# Patient Record
Sex: Female | Born: 1950 | Race: White | Hispanic: No | Marital: Married | Smoking: Never smoker
Health system: Southern US, Community
[De-identification: ages and names within clinical notes are randomized; demographics above are authoritative.]

## PROBLEM LIST (undated history)

## (undated) DIAGNOSIS — F32A Depression, unspecified: Secondary | ICD-10-CM

## (undated) DIAGNOSIS — M329 Systemic lupus erythematosus, unspecified: Secondary | ICD-10-CM

## (undated) DIAGNOSIS — M87051 Idiopathic aseptic necrosis of right femur: Secondary | ICD-10-CM

## (undated) DIAGNOSIS — M8580 Other specified disorders of bone density and structure, unspecified site: Secondary | ICD-10-CM

## (undated) DIAGNOSIS — N3010 Interstitial cystitis (chronic) without hematuria: Secondary | ICD-10-CM

## (undated) DIAGNOSIS — F419 Anxiety disorder, unspecified: Secondary | ICD-10-CM

## (undated) DIAGNOSIS — M87052 Idiopathic aseptic necrosis of left femur: Secondary | ICD-10-CM

## (undated) DIAGNOSIS — F329 Major depressive disorder, single episode, unspecified: Secondary | ICD-10-CM

## (undated) DIAGNOSIS — N390 Urinary tract infection, site not specified: Secondary | ICD-10-CM

## (undated) DIAGNOSIS — E079 Disorder of thyroid, unspecified: Secondary | ICD-10-CM

## (undated) HISTORY — PX: TOTAL ABDOMINAL HYSTERECTOMY: SHX209

## (undated) HISTORY — DX: Idiopathic aseptic necrosis of left femur: M87.052

## (undated) HISTORY — DX: Interstitial cystitis (chronic) without hematuria: N30.10

## (undated) HISTORY — DX: Anxiety disorder, unspecified: F41.9

## (undated) HISTORY — DX: Systemic lupus erythematosus, unspecified: M32.9

## (undated) HISTORY — DX: Major depressive disorder, single episode, unspecified: F32.9

## (undated) HISTORY — DX: Idiopathic aseptic necrosis of right femur: M87.051

## (undated) HISTORY — DX: Urinary tract infection, site not specified: N39.0

## (undated) HISTORY — DX: Disorder of thyroid, unspecified: E07.9

## (undated) HISTORY — PX: TUBAL LIGATION: SHX77

## (undated) HISTORY — DX: Other specified disorders of bone density and structure, unspecified site: M85.80

## (undated) HISTORY — PX: CYSTOSCOPY: SUR368

## (undated) HISTORY — DX: Depression, unspecified: F32.A

## (undated) HISTORY — PX: URETHRAL DILATION: SUR417

## (undated) HISTORY — PX: WISDOM TOOTH EXTRACTION: SHX21

---

## 2007-06-30 ENCOUNTER — Other Ambulatory Visit: Payer: Self-pay

## 2007-06-30 ENCOUNTER — Emergency Department: Payer: Self-pay | Admitting: Unknown Physician Specialty

## 2007-07-01 ENCOUNTER — Ambulatory Visit: Payer: Self-pay | Admitting: Unknown Physician Specialty

## 2008-02-20 ENCOUNTER — Emergency Department: Payer: Self-pay | Admitting: Emergency Medicine

## 2008-03-05 ENCOUNTER — Ambulatory Visit: Payer: Self-pay | Admitting: Gynecology

## 2008-04-09 ENCOUNTER — Ambulatory Visit: Payer: Self-pay | Admitting: Gynecology

## 2008-07-17 ENCOUNTER — Emergency Department: Payer: Self-pay | Admitting: Emergency Medicine

## 2008-07-17 ENCOUNTER — Other Ambulatory Visit: Payer: Self-pay

## 2010-02-25 ENCOUNTER — Ambulatory Visit: Payer: Self-pay | Admitting: Obstetrics and Gynecology

## 2010-02-26 ENCOUNTER — Encounter: Payer: Self-pay | Admitting: Obstetrics & Gynecology

## 2010-02-26 LAB — CONVERTED CEMR LAB
Clue Cells Wet Prep HPF POC: NONE SEEN
Trich, Wet Prep: NONE SEEN
Yeast Wet Prep HPF POC: NONE SEEN

## 2011-02-26 ENCOUNTER — Ambulatory Visit (INDEPENDENT_AMBULATORY_CARE_PROVIDER_SITE_OTHER): Payer: Medicare Other | Admitting: Obstetrics & Gynecology

## 2011-02-26 DIAGNOSIS — Z01419 Encounter for gynecological examination (general) (routine) without abnormal findings: Secondary | ICD-10-CM

## 2011-04-07 NOTE — Assessment & Plan Note (Signed)
[name], Martha Mckinney NO.:  [id number]   MEDICAL RECORD NO.:  [id number]          PATIENT TYPE:  POB   LOCATION:  [facility] at [facility]         FACILITY:  [facility]   PHYSICIAN:  Ginger Carne, MD DATE OF BIRTH:  1951-07-08   DATE OF SERVICE:  03/05/2008                                  CLINIC NOTE   This is a 60 year old Caucasian female who presents with the primary  complaint of dyspareunia and vaginal dryness and burning with voiding.  She has had a hysterectomy in the 1980s.  The patient states she has  tried Premarin vaginal cream and has had irritation from this  medication.  She has also tried an Field seismologist, which apparently has also  been irritating to her.  She states that Astroglide has not been  beneficial.  Her dyspareunia and inability to have intercourse has had a  negative impact in her quality of marital life.   Her medical history is complicated and the reader is referred to the  current medication list as well as her gynecological history sheet for  more information.  She denies genuine urinary stress incontinence or  fecal incontinence.   PHYSICAL EXAM:  External genitalia, vulva and vagina demonstrates one-  finger admittance with significant vaginismus.  No discharge is noted.  Both adnexa are palpable, found to be normal.  No evidence on the vulva  of abnormalities suggestive of lichen sclerosis or hypertrophic lesions.   IMPRESSION:  Postmenopausal vaginal atrophy.   PLAN:  I will try a very low dose of Estrace to use at a small dose of  the time, a fingertip, to be applied in the vagina and in the fourchette  to see if the patient will tolerate this.  If so, the dosage will be  advanced to add more to up to 1 g three times a week.  The patient was  advised that she may continue to have burning with same.  However, the  patient was explained that the either conjugated estrogens or estradiol  at this time are the only vaginal medications  that would be available to  her.  The other option would be to increase her oral estradiol, but I am  uncertain that this will be of much benefit to her.  I have asked her to  return in 3 weeks for follow-up.           ______________________________  Ginger Carne, MD     SHB/MEDQ  D:  03/05/2008  T:  03/05/2008  Job:  [id number]

## 2011-04-07 NOTE — Assessment & Plan Note (Signed)
Martha Mckinney, BISKUP NO.:  [id number]   MEDICAL RECORD NO.:  [id number]          PATIENT TYPE:  POB   LOCATION:  [facility] at [place]         FACILITY:  [facility]   PHYSICIAN:  Catalina Antigua, MD     DATE OF BIRTH:  05-13-1951   DATE OF SERVICE:  02/25/2010                                  CLINIC NOTE   This is a 60 year old female who presents for evaluation of a 50-month  history of vaginal irritation.  The patient states that for the past 2  months, she has been treated several times for a yeast infection with  Diflucan, Gyne-Lotrimin, and metronidazole without any relief of her  symptoms.  The patient states that she gets temporary relief like one  day or two, but symptoms of severe pruritus and dyspareunia return.  The  patient denies any vaginal dryness, abnormal bleeding, but only reports  the presence of a cottage-cheese discharge.  The patient is otherwise up  to date in her healthcare.  She has had a mammogram this year and is  being followed by a primary care physician.   PHYSICAL EXAMINATION:  VITAL SIGNS:  Blood pressure is 114/86, pulse of  77, weight of 112 pounds, height of 5 feet.  ABDOMEN:  Soft, nontender, nondistended.  PELVIC:  Normal external female genitalia, normal appearing vaginal  mucosa with thick whitish adherent discharge.  Vaginal cuff is intact  and the cervix was visualized.   ASSESSMENT AND PLAN:  This is a 60 year old with clinical findings of a  yeast infection.  Wet prep and yeast culture were collected.  Prescription for boric acid was provided.  The patient was instructed to  return if symptoms are not alleviated after her course of her boric  acid.           ______________________________  Catalina Antigua, MD     PC/MEDQ  D:  02/25/2010  T:  02/26/2010  Job:  [id number]

## 2011-04-07 NOTE — Assessment & Plan Note (Signed)
Martha Mckinney, Martha Mckinney NO.:  [id number]   MEDICAL RECORD NO.:  [id number]          PATIENT TYPE:  POB   LOCATION:  [facility] at [facility] [place] [facility]         FACILITY:  [facility]   PHYSICIAN:  Ginger Carne, MD DATE OF BIRTH:  Nov 18, 1951   DATE OF SERVICE:  04/09/2008                                  CLINIC NOTE   This patient returns today for followup from an initial office visit of  March 05, 2008.  This is a 60 year old Caucasian female who presented  with a primary complaint of dyspareunia and vaginal dryness with burning  on voiding.  She had a hysterectomy in the 1980s and using Premarin has  caused her significant irritation with worsening of burning.  This also  included an Estring with similar symptoms.  Astroglide has not been  beneficial insofar as intercourse which has not been possible over  recent 1 to 2 years.  The patient was initially started on estradiol 1/2  mg daily and low dose of Estrace to use a fingertip amount in the vagina  and fourchette once to twice weekly to see if this would be both  tolerated and helpful.  She returns today stating that given the minimal  dosing that she has used, she is able to tolerate said amounts of  Estrace cream without burning and feels slightly better as far as  vaginal burning is concerned.  I did not do an examination today,  however, I have asked her to increase the Estrace cream amount to 1 dab  on the finger three times a week for the next 2 weeks including the  fourchette and throughout the vagina.  If she can tolerate this, I asked  her to go to 1/2 gram in an applicator two to three times a week for the  next 3 to 4 weeks followed by 1 gram for the next 2 to 3 weeks  indefinitely.  Her estradiol was increased to 1 gram a day and she also  was asked to return in 2 months for followup.           ______________________________  Ginger Carne, MD     SHB/MEDQ  D:  04/09/2008  T:  04/09/2008  Job:  [id number]

## 2011-04-21 ENCOUNTER — Ambulatory Visit: Payer: Medicare Other | Admitting: Obstetrics & Gynecology

## 2011-06-05 NOTE — Assessment & Plan Note (Signed)
NAME:  Martha Mckinney, Martha Mckinney NO.:  [id number]  MEDICAL RECORD NO.:  [id number]          PATIENT TYPE:  POB  LOCATION:  [facility] at [facility]         FACILITY:  [facility]  PHYSICIAN:  Allie Bossier, MD        DATE OF BIRTH:  09-Mar-1951  DATE OF SERVICE:  02/26/2011                                 CLINIC NOTE  HISTORY OF PRESENT ILLNESS:  Ms. Tash is a 60 year old married white gravida 2, para 2.  She has a 59 year old estranged daughter with 3 grandchildren and a 41 year old son who is currently disabled and lives in [place].  She is here for her annual exam.  Her complaint today is of many years history of vulvar burning and pruritus.  She has been tried in the distant past on vaginal estrogen but the immediate affect is to have more burning with the creams.  She has had numerous wet preps that always come up normal.  She has been treated empirically with boric acid, metronidazole, clotrimazole, etc.  None of these have relieved her symptoms.  She is finding intercourse extremely difficult with her "well in doubt husband" and essentially does not have sex because of this issue.  PAST MEDICAL HISTORY: 1. Lupus. 2. Anxiety. 3. Depression. 4. Hypothyroidism. 5. 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 6. Osteopenia. 7. Recurrent UTIs. 8. Probable interstitial cystitis.  REVIEW OF SYSTEMS:  This is her second marriage and she has been married for approximately 8 years.  She has been abstinent for last 6 months at least.  Her husband is recently status post prostate surgery but is still able to maintain an erection.  She has always had a decreased libido.  She sees Dr. Vickki Hearing as her rheumatologist and Dr. Genia Hotter in [place] as her primary care doctor.  Her last mammogram was in March 2011 and she scheduled that herself in [place].  She had a colonoscopy in approximately 2006 and polyps were found.  Of note, she is going to call her GI doctor and see when she needs her  next colonoscopy.  PAST SURGICAL HISTORY:  Two C-sections, tubal ligation, TAH, wisdom teeth extraction, and multiple cystoscopies and urethral dilations.  ALLERGIES:  No latex allergies.  Her drug allergies are SULFA and ERYTHROMYCIN.  MEDICATIONS: 1. Klonopin 2 mg p.r.n. 2. Levothyroxine 50 mcg daily. 3. Topamax 100 mg b.i.d. 4. Spironolactone when she has excess fluid build up from her     prednisone. 5. Omeprazole daily. 6. Prednisone 20 mg daily currently. 7. Calcium b.i.d.  PHYSICAL EXAMINATION:  GENERAL:  Very pleasant, well-nourished, thin white female. VITAL SIGNS:  Height 5 feet, weight 102 pounds, blood pressure 111/74, and pulse 67. HEENT:  Normal. HEART:  Regular rate and rhythm. LUNGS:  Clear to auscultation bilaterally. BREASTS:  Normal. ABDOMEN:  Scaphoid.  Well-healed vertical incision.  No palpable hepatosplenomegaly. EXTERNAL GENITALIA:  Marked atrophy.  Her vagina has no lesions, no discharge, and marked atrophy was noted.  Bimanual exam reveals no masses.  ASSESSMENT AND PLAN: 1. Annual exam.  I have not checked the Pap smear because her uterus     was removed for benign indications, but I have recommended self-  breast and self-vulvar exams along with a speculum exam annually. 2. Longstanding vulvar pruritus/burning.  Since vaginal estrogen tends     to exacerbate her issues, I am recommending a p.o. estrogen     (Enjuvia 0.9 mg that is what we have samples of).  This will have     multiple functions.  It will not only hopefully cure her vaginal     atrophy but it will also help her osteopenia. 3. Osteopenia.  I am scheduling a DEXA scan.  With regard to her     vulva, she will see me in 2 months.  If her symptoms are not     dramatically improved, I will recommend a random biopsy of her     vulva just to see if there is any underlying dermatologic     condition.     Allie Bossier, MD    MCD/MEDQ  D:  02/26/2011  T:  02/27/2011  Job:   [id number]

## 2011-07-29 ENCOUNTER — Encounter: Payer: Self-pay | Admitting: Gynecology

## 2011-07-30 ENCOUNTER — Ambulatory Visit (INDEPENDENT_AMBULATORY_CARE_PROVIDER_SITE_OTHER): Payer: Medicare Other | Admitting: Obstetrics & Gynecology

## 2011-07-30 ENCOUNTER — Encounter: Payer: Self-pay | Admitting: Obstetrics & Gynecology

## 2011-07-30 VITALS — BP 110/67 | HR 71 | Ht <= 58 in | Wt 91.0 lb

## 2011-07-30 DIAGNOSIS — N951 Menopausal and female climacteric states: Secondary | ICD-10-CM

## 2011-07-30 MED ORDER — ESTRATEST 1.25-2.5 MG PO TABS: 1.0000 | ORAL_TABLET | Freq: Every day | ORAL | Status: DC

## 2011-07-30 NOTE — Progress Notes (Signed)
  Subjective:    Patient ID: Martha Mckinney, female    DOB: Oct 04, 1951, 60 y.o.   MRN: [id number]  HPI Martha Mckinney is here because she would like to try a different ERT.  The Enjuvia caused her vulvar burning to be increased.  She would like to try one with testosterone as she complains of a decreased libido.   Review of Systems     Objective:   Physical Exam        Assessment & Plan:  Menopausal symptoms-rec Estratest

## 2011-09-02 ENCOUNTER — Inpatient Hospital Stay: Payer: Self-pay | Admitting: Psychiatry

## 2011-09-17 ENCOUNTER — Ambulatory Visit: Payer: Medicare Other | Admitting: Obstetrics & Gynecology

## 2011-09-17 DIAGNOSIS — N959 Unspecified menopausal and perimenopausal disorder: Secondary | ICD-10-CM

## 2011-11-03 ENCOUNTER — Telehealth: Payer: Self-pay

## 2011-11-03 NOTE — Telephone Encounter (Signed)
Patient needs boric acid suppositories called in, this is the only thing that works for her yeast. She needs them today if all possible by 4pm her husband gets off at that time and will be picking them up. [facility] ph: [phone], thanks!

## 2011-12-24 ENCOUNTER — Ambulatory Visit (INDEPENDENT_AMBULATORY_CARE_PROVIDER_SITE_OTHER): Payer: Medicare Other | Admitting: Obstetrics & Gynecology

## 2011-12-24 ENCOUNTER — Other Ambulatory Visit: Payer: Self-pay | Admitting: Obstetrics & Gynecology

## 2011-12-24 DIAGNOSIS — L292 Pruritus vulvae: Secondary | ICD-10-CM

## 2011-12-24 DIAGNOSIS — L293 Anogenital pruritus, unspecified: Secondary | ICD-10-CM
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
## 2011-12-31 ENCOUNTER — Ambulatory Visit (INDEPENDENT_AMBULATORY_CARE_PROVIDER_SITE_OTHER): Payer: Medicare Other | Admitting: Obstetrics & Gynecology

## 2011-12-31 ENCOUNTER — Encounter: Payer: Self-pay | Admitting: Obstetrics & Gynecology

## 2011-12-31 VITALS — BP 98/69 | HR 60 | Ht 59.0 in | Wt 102.0 lb

## 2011-12-31 DIAGNOSIS — L293 Anogenital pruritus, unspecified: Secondary | ICD-10-CM

## 2011-12-31 DIAGNOSIS — L292 Pruritus vulvae: Secondary | ICD-10-CM

## 2011-12-31 MED ORDER — CLOBETASOL PROPIONATE 0.05 % EX GEL: CUTANEOUS | Status: DC

## 2011-12-31 NOTE — Progress Notes (Signed)
  Subjective:    Patient ID: Martha Mckinney, female    DOB: [birth year], 61 y.o.   MRN: [id number]  HPI  She is here today to review her labs/pathology.  Review of Systems     Objective:   Physical Exam   Well healed biopsy site, vagina mucosa with severe atrophy.     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 She will RTC in 1 month. I may add vagifem if she is not asymptomatic by then.

## 2012-01-01 ENCOUNTER — Encounter: Payer: Self-pay | Admitting: Obstetrics & Gynecology

## 2012-01-28 ENCOUNTER — Ambulatory Visit (INDEPENDENT_AMBULATORY_CARE_PROVIDER_SITE_OTHER): Payer: Medicare Other | Admitting: Obstetrics & Gynecology

## 2012-01-28 ENCOUNTER — Encounter: Payer: Self-pay | Admitting: Obstetrics & Gynecology

## 2012-01-28 VITALS — BP 116/84 | HR 69 | Ht 59.0 in | Wt 103.0 lb

## 2012-01-28 DIAGNOSIS — N952 Postmenopausal atrophic vaginitis: Secondary | ICD-10-CM

## 2012-01-28 MED ORDER — ESTRADIOL 10 MCG VA TABS: ORAL_TABLET | VAGINAL | Status: DC

## 2012-01-28 NOTE — Progress Notes (Signed)
  Subjective:    Patient ID: Martha Mckinney, female    DOB: Jul 25, 1951, 61 y.o.   MRN: [id number]  HPI   She is here for follow up of her vulvar pain/burning. After using the temovate several times per week, her symptoms have resolved! She is very happy. Review of Systems Mammogram normal about 6 months ago    Objective:   Physical Exam Atrophic vulva, but no erythema       Assessment & Plan:  Continue temovate indefinitely, Add vagifem RTC for annual

## 2013-08-29 ENCOUNTER — Ambulatory Visit (INDEPENDENT_AMBULATORY_CARE_PROVIDER_SITE_OTHER): Payer: Medicare Other | Admitting: Obstetrics & Gynecology

## 2013-08-29 ENCOUNTER — Encounter: Payer: Self-pay | Admitting: Obstetrics & Gynecology

## 2013-08-29 VITALS — BP 134/74 | HR 61 | Ht <= 58 in | Wt 99.2 lb

## 2013-08-29 DIAGNOSIS — R350 Frequency of micturition: Secondary | ICD-10-CM

## 2013-08-29 DIAGNOSIS — Z23 Encounter for immunization: Secondary | ICD-10-CM

## 2013-08-29 DIAGNOSIS — Z01419 Encounter for gynecological examination (general) (routine) without abnormal findings: Secondary | ICD-10-CM

## 2013-08-29 DIAGNOSIS — Z0000 Encounter for general adult medical examination without abnormal findings: Secondary | ICD-10-CM

## 2013-08-29 LAB — POCT URINALYSIS DIPSTICK
Blood, UA: NEGATIVE
Nitrite, UA: NEGATIVE
Urobilinogen, UA: NEGATIVE
pH, UA: 6

## 2013-08-29 NOTE — Patient Instructions (Signed)
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
## 2013-08-31 ENCOUNTER — Telehealth: Payer: Self-pay | Admitting: *Deleted

## 2013-08-31 NOTE — Telephone Encounter (Signed)
EStriodiol vaginal cream called into [facility] pharmacy.  0.2mg  using 1/2 gram per vagina 3x week.

## 2013-09-04 ENCOUNTER — Telehealth: Payer: Self-pay | Admitting: *Deleted

## 2013-09-04 NOTE — Telephone Encounter (Signed)
Pt called and wanted to know her urine culture results, because pt was still having UTI symptoms.  I called in Cipro 500 mg, take 1 tablet BID x 3 days #6 no refills.  Pt aware.

## 2013-09-27 ENCOUNTER — Telehealth: Payer: Self-pay | Admitting: *Deleted

## 2013-09-27 NOTE — Telephone Encounter (Signed)
Pt called and c/o symptoms of a UTI.  I called in Levaquin 500 mg, take 1 tablet daily for 5 days.  #5 with no refills.  Pt aware.  Pt needs to come into the office for a visit if this medication does not help.

## 2014-03-14 ENCOUNTER — Telehealth: Payer: Self-pay | Admitting: *Deleted

## 2014-03-14 DIAGNOSIS — L900 Lichen sclerosus et atrophicus: Secondary | ICD-10-CM

## 2014-03-14 MED ORDER — CLOBETASOL PROPIONATE 0.05 % EX GEL: CUTANEOUS | Status: DC

## 2014-03-14 NOTE — Telephone Encounter (Signed)
Patient needs a refill of her clobetasol cream.  She is also having trouble using her estrogen cream and is interested in switching to an estrogen tablet to help her issue.  She will make an appointment to see the physician to discuss this.

## 2014-04-11 NOTE — Telephone Encounter (Signed)
ERROR NOTE

## 2014-04-25 ENCOUNTER — Ambulatory Visit: Payer: Medicare Other | Admitting: Obstetrics & Gynecology

## 2015-10-31 ENCOUNTER — Telehealth: Payer: Self-pay

## 2015-10-31 NOTE — Telephone Encounter (Signed)
Received a fax from [facility], on the face sheet there was a written message "Pt asked for referral w/ Dr. Marice Potterove regarding left ovarian cyst that was found on CT during urology consult. Martha Hashimotoatricia is already an established patient at out facility, contacted her to schedule an appointment, She declined a December appointment stating she had too many things to do, offered to make her a January appointment, she declined. She states she will call the office when she is ready to schedule an appointment. Records from [facility] given to CMA to be scanned in.

## 2015-11-04 ENCOUNTER — Encounter: Payer: Self-pay | Admitting: *Deleted

## 2015-12-30 ENCOUNTER — Inpatient Hospital Stay
Admission: EM | Admit: 2015-12-30 | Discharge: 2016-01-01 | DRG: 481 | Disposition: A | Discharge status: Home / Self Care | Payer: Medicare Other | Attending: Internal Medicine | Admitting: Internal Medicine

## 2015-12-30 ENCOUNTER — Emergency Department: Payer: Medicare Other

## 2015-12-30 ENCOUNTER — Encounter: Payer: Self-pay | Admitting: *Deleted

## 2015-12-30 DIAGNOSIS — W19XXXA Unspecified fall, initial encounter: Secondary | ICD-10-CM | POA: Diagnosis present

## 2015-12-30 DIAGNOSIS — Z419 Encounter for procedure for purposes other than remedying health state, unspecified: Secondary | ICD-10-CM

## 2015-12-30 DIAGNOSIS — Z01818 Encounter for other preprocedural examination: Secondary | ICD-10-CM | POA: Diagnosis present

## 2015-12-30 DIAGNOSIS — S72012A Unspecified intracapsular fracture of left femur, initial encounter for closed fracture: Principal | ICD-10-CM | POA: Diagnosis present

## 2015-12-30 DIAGNOSIS — F419 Anxiety disorder, unspecified: Secondary | ICD-10-CM | POA: Diagnosis present

## 2015-12-30 DIAGNOSIS — Z79899 Other long term (current) drug therapy: Secondary | ICD-10-CM

## 2015-12-30 DIAGNOSIS — Z8049 Family history of malignant neoplasm of other genital organs: Secondary | ICD-10-CM | POA: Diagnosis not present

## 2015-12-30 DIAGNOSIS — Z801 Family history of malignant neoplasm of trachea, bronchus and lung: Secondary | ICD-10-CM

## 2015-12-30 DIAGNOSIS — E039 Hypothyroidism, unspecified: Secondary | ICD-10-CM | POA: Diagnosis present

## 2015-12-30 DIAGNOSIS — Z90710 Acquired absence of both cervix and uterus: Secondary | ICD-10-CM | POA: Diagnosis not present

## 2015-12-30 DIAGNOSIS — F329 Major depressive disorder, single episode, unspecified: Secondary | ICD-10-CM | POA: Diagnosis present

## 2015-12-30 DIAGNOSIS — Z882 Allergy status to sulfonamides status: Secondary | ICD-10-CM

## 2015-12-30 DIAGNOSIS — Z9851 Tubal ligation status: Secondary | ICD-10-CM

## 2015-12-30 DIAGNOSIS — Z820 Family history of epilepsy and other diseases of the nervous system: Secondary | ICD-10-CM | POA: Diagnosis not present

## 2015-12-30 DIAGNOSIS — Z803 Family history of malignant neoplasm of breast: Secondary | ICD-10-CM | POA: Diagnosis not present

## 2015-12-30 DIAGNOSIS — Z818 Family history of other mental and behavioral disorders: Secondary | ICD-10-CM | POA: Diagnosis not present

## 2015-12-30 DIAGNOSIS — M8580 Other specified disorders of bone density and structure, unspecified site: Secondary | ICD-10-CM | POA: Diagnosis present

## 2015-12-30 DIAGNOSIS — Y93K1 Activity, walking an animal: Secondary | ICD-10-CM | POA: Diagnosis not present

## 2015-12-30 DIAGNOSIS — Z87440 Personal history of urinary (tract) infections: Secondary | ICD-10-CM

## 2015-12-30 DIAGNOSIS — G43909 Migraine, unspecified, not intractable, without status migrainosus: Secondary | ICD-10-CM | POA: Diagnosis present

## 2015-12-30 DIAGNOSIS — Z9889 Other specified postprocedural states: Secondary | ICD-10-CM | POA: Diagnosis not present

## 2015-12-30 DIAGNOSIS — F32A Depression, unspecified: Secondary | ICD-10-CM | POA: Diagnosis present

## 2015-12-30 DIAGNOSIS — Z888 Allergy status to other drugs, medicaments and biological substances status: Secondary | ICD-10-CM

## 2015-12-30 DIAGNOSIS — M329 Systemic lupus erythematosus, unspecified: Secondary | ICD-10-CM | POA: Diagnosis present

## 2015-12-30 DIAGNOSIS — Z681 Body mass index (BMI) 19 or less, adult: Secondary | ICD-10-CM

## 2015-12-30 DIAGNOSIS — S7292XA Unspecified fracture of left femur, initial encounter for closed fracture: Secondary | ICD-10-CM | POA: Diagnosis present

## 2015-12-30 DIAGNOSIS — S72002A Fracture of unspecified part of neck of left femur, initial encounter for closed fracture: Secondary | ICD-10-CM

## 2015-12-30 LAB — CBC WITH DIFFERENTIAL/PLATELET
BASOS PCT: 0 %
Basophils Absolute: 0 10*3/uL (ref 0–0.1)
Eosinophils Absolute: 0 10*3/uL (ref 0–0.7)
Eosinophils Relative: 0 %
HEMATOCRIT: 42.3 % (ref 35.0–47.0)
HEMOGLOBIN: 14.2 g/dL (ref 12.0–16.0)
LYMPHS PCT: 17 %
Lymphs Abs: 1.4 10*3/uL (ref 1.0–3.6)
MCH: 31 pg (ref 26.0–34.0)
MCHC: 33.6 g/dL (ref 32.0–36.0)
MCV: 92.5 fL (ref 80.0–100.0)
MONO ABS: 0.6 10*3/uL (ref 0.2–0.9)
MONOS PCT: 7 %
NEUTROS ABS: 6.3 10*3/uL (ref 1.4–6.5)
NEUTROS PCT: 76 %
Platelets: 202 10*3/uL (ref 150–440)
RBC: 4.58 MIL/uL (ref 3.80–5.20)
RDW: 14.5 % (ref 11.5–14.5)
WBC: 8.4 10*3/uL (ref 3.6–11.0)

## 2015-12-30 LAB — COMPREHENSIVE METABOLIC PANEL
ALT: 15 U/L (ref 14–54)
ANION GAP: 5 (ref 5–15)
AST: 18 U/L (ref 15–41)
Albumin: 3.8 g/dL (ref 3.5–5.0)
Alkaline Phosphatase: 49 U/L (ref 38–126)
BILIRUBIN TOTAL: 0.2 mg/dL — AB (ref 0.3–1.2)
BUN: 12 mg/dL (ref 6–20)
CALCIUM: 9.4 mg/dL (ref 8.9–10.3)
CO2: 23 mmol/L (ref 22–32)
Chloride: 114 mmol/L — ABNORMAL HIGH (ref 101–111)
Creatinine, Ser: 0.97 mg/dL (ref 0.44–1.00)
GFR calc non Af Amer: >60 mL/min (ref >60)
GLUCOSE: 86 mg/dL (ref 65–99)
POTASSIUM: 3.7 mmol/L (ref 3.5–5.1)
Sodium: 142 mmol/L (ref 135–145)
TOTAL PROTEIN: 6.5 g/dL (ref 6.5–8.1)

## 2015-12-30 MED ORDER — SERTRALINE HCL 100 MG PO TABS
200.0000 mg | ORAL_TABLET | Freq: Every day | ORAL | Status: DC
Start: 2015-12-30 — End: 2016-01-01
  Administered 2015-12-30 – 2015-12-31 (×2): 200 mg via ORAL
  Filled 2015-12-30 (×2): qty 2

## 2015-12-30 MED ORDER — HYDROMORPHONE HCL 1 MG/ML IJ SOLN
0.5000 mg | Freq: Once | INTRAMUSCULAR | Status: AC
  Administered 2015-12-30: 0.5 mg via INTRAVENOUS
  Filled 2015-12-30: qty 1

## 2015-12-30 MED ORDER — CLONAZEPAM 1 MG PO TABS
1.0000 mg | ORAL_TABLET | Freq: Two times a day (BID) | ORAL | Status: DC
  Administered 2015-12-30 – 2016-01-01 (×3): 1 mg via ORAL
  Filled 2015-12-30 (×3): qty 1

## 2015-12-30 MED ORDER — ONDANSETRON HCL 4 MG PO TABS: 4.0000 mg | ORAL_TABLET | Freq: Four times a day (QID) | ORAL | Status: DC | PRN

## 2015-12-30 MED ORDER — ACETAMINOPHEN 325 MG PO TABS
650.0000 mg | ORAL_TABLET | Freq: Four times a day (QID) | ORAL | Status: DC | PRN
  Administered 2015-12-30: 650 mg via ORAL
  Filled 2015-12-30: qty 2

## 2015-12-30 MED ORDER — ACETAMINOPHEN 650 MG RE SUPP
650.0000 mg | Freq: Four times a day (QID) | RECTAL | Status: DC | PRN
Start: 2015-12-30 — End: 2015-12-31

## 2015-12-30 MED ORDER — TOPIRAMATE 100 MG PO TABS
100.0000 mg | ORAL_TABLET | Freq: Every day | ORAL | Status: DC
  Administered 2016-01-01: 100 mg via ORAL
  Filled 2015-12-30 (×2): qty 1

## 2015-12-30 MED ORDER — HYDROMORPHONE HCL 1 MG/ML IJ SOLN
0.5000 mg | INTRAMUSCULAR | Status: DC | PRN
Start: 2015-12-30 — End: 2015-12-31
  Administered 2015-12-30 – 2015-12-31 (×2): 0.5 mg via INTRAVENOUS
  Filled 2015-12-30 (×2): qty 1

## 2015-12-30 MED ORDER — CEFAZOLIN SODIUM-DEXTROSE 2-3 GM-% IV SOLR
2.0000 g | INTRAVENOUS | Status: AC
  Administered 2015-12-31: 2 g via INTRAVENOUS
  Filled 2015-12-30 (×2): qty 50

## 2015-12-30 MED ORDER — ONDANSETRON HCL 4 MG/2ML IJ SOLN
4.0000 mg | Freq: Four times a day (QID) | INTRAMUSCULAR | Status: DC | PRN
  Administered 2015-12-30 – 2015-12-31 (×2): 4 mg via INTRAVENOUS
  Filled 2015-12-30 (×2): qty 2

## 2015-12-30 MED ORDER — SODIUM CHLORIDE 0.9 % IV SOLN
INTRAVENOUS | Status: DC
  Administered 2015-12-30: 22:00:00 via INTRAVENOUS

## 2015-12-30 MED ORDER — LEVOTHYROXINE SODIUM 50 MCG PO TABS
50.0000 ug | ORAL_TABLET | Freq: Every day | ORAL | Status: DC
  Administered 2016-01-01: 50 ug via ORAL
  Filled 2015-12-30 (×2): qty 1

## 2015-12-30 MED ORDER — TOPIRAMATE 100 MG PO TABS
200.0000 mg | ORAL_TABLET | Freq: Every day | ORAL | Status: DC
  Administered 2015-12-30 – 2015-12-31 (×2): 200 mg via ORAL
  Filled 2015-12-30: qty 2

## 2015-12-30 MED ORDER — HYDROMORPHONE HCL 1 MG/ML IJ SOLN
1.0000 mg | Freq: Once | INTRAMUSCULAR | Status: AC
  Administered 2015-12-30: 1 mg via INTRAVENOUS
  Filled 2015-12-30: qty 1

## 2015-12-30 MED ORDER — HYDROMORPHONE HCL 1 MG/ML IJ SOLN
1.0000 mg | Freq: Once | INTRAMUSCULAR | Status: AC
  Administered 2015-12-30: 1 mg via INTRAMUSCULAR
  Filled 2015-12-30: qty 1
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
## 2015-12-30 NOTE — ED Provider Notes (Signed)
[facility] Emergency Department Provider Note  ____________________________________________  Time seen: Approximately 3:59 PM  I have reviewed the triage vital signs and the nursing notes.   HISTORY  Chief Complaint Hip Pain and Fall    HPI Martha Mckinney is a 65 y.o. female patient arrived via EMS secondary to a fall while walking her dog.Patient landed on left hip. Patient states she was unable to get abs secondary to fall and crawl back to her apartment. Neighbors all to EMS. Patient states pain is to the lateral hip radiates into the left groin and she is also having thigh pain. Patient rates the pain as a 7/10. No palliative measures taken prior to arrival. Patient is concerned because she has AVN of both hips. She also has osteopenia.   Past Medical History  Diagnosis Date  . Systemic lupus erythematosus (HCC)   . Anxiety   . Depression   . Thyroid disease     HYPOTHYRIODISM  . Avascular necrosis of bones of both hips (HCC)   . Osteopenia   . Recurrent UTI   . Interstitial cystitis     There are no active problems to display for this patient.   Past Surgical History  Procedure Laterality Date  . Cesarean section      X 2  . Tubal ligation    . Total abdominal hysterectomy    . Wisdom tooth extraction    . Cystoscopy    . Urethral dilation      Current Outpatient Rx  Name  Route  Sig  Dispense  Refill  . Calcium Carbonate Antacid (CALCIUM ANTACID PO)   Oral   Take by mouth.           . clobetasol (TEMOVATE) 0.05 % GEL      Use small amount 3 times per week at bedtime   15 each   12   . clonazePAM (KLONOPIN) 2 MG tablet   Oral   Take 1 mg by mouth 2 (two) times daily as needed.          Marland Kitchen HYDROcodone-acetaminophen (NORCO) 10-325 MG per tablet               . hydroxychloroquine (PLAQUENIL) 200 MG tablet               . levothyroxine (SYNTHROID, LEVOTHROID) 50 MCG tablet   Oral   Take 50 mcg by mouth daily.            Marland Kitchen OLANZapine (ZYPREXA) 5 MG tablet               . omeprazole (PRILOSEC) 40 MG capsule   Oral   Take 40 mg by mouth daily.           Marland Kitchen oxyCODONE-acetaminophen (PERCOCET) 5-325 MG per tablet               . predniSONE (DELTASONE) 20 MG tablet   Oral   Take 20 mg by mouth daily.           . promethazine (PHENERGAN) 25 MG tablet               . sertraline (ZOLOFT) 100 MG tablet               . SPIRONOLACTONE PO   Oral   Take by mouth.           . topiramate (TOPAMAX) 100 MG tablet   Oral   Take 100 mg by mouth  2 (two) times daily.           Marland Kitchen zolpidem (AMBIEN) 10 MG tablet                 Allergies Erythromycin and Sulfa antibiotics  Family History  Problem Relation Age of Onset  . Cancer Father     LUNG  . Cancer Mother     BREAST  . Alzheimer's disease Mother   . Bipolar disorder Mother   . Cancer Paternal Grandmother     uterine cancer    Social History Social History  Substance Use Topics  . Smoking status: Never Smoker   . Smokeless tobacco: Never Used  . Alcohol Use: No    Review of Systems Constitutional: No fever/chills Eyes: No visual changes. ENT: No sore throat. Cardiovascular: Denies chest pain. Respiratory: Denies shortness of breath. Gastrointestinal: No abdominal pain.  No nausea, no vomiting.  No diarrhea.  No constipation. Genitourinary: Negative for dysuria. Musculoskeletal: Left hip and femur pain. Skin: Negative for rash. Neurological: Negative for headaches, focal weakness or numbness. Psychiatric:Anxiety and depression Endocrine:Hyperthyroidism Hematological/Lymphatic:Lupus Allergic/Immunilogical: Erythromycin and sulfa antibiotics  10-point ROS otherwise negative.  ____________________________________________   PHYSICAL EXAM:  VITAL SIGNS: ED Triage Vitals  Enc Vitals Group     BP 12/30/15 1555 93/42 mmHg     Pulse Rate 12/30/15 1555 62     Resp 12/30/15 1555 18     Temp  12/30/15 1555 98 F (36.7 C)     Temp Source 12/30/15 1555 Oral     SpO2 12/30/15 1555 99 %     Weight 12/30/15 1555 99 lb (44.906 kg)     Height 12/30/15 1555 5' (1.524 m)     Head Cir --      Peak Flow --      Pain Score 12/30/15 1556 7     Pain Loc --      Pain Edu? --      Excl. in GC? --     Constitutional: Alert and oriented. Well appearing and in no acute distress. Eyes: Conjunctivae are normal. PERRL. EOMI. Head: Atraumatic. Nose: No congestion/rhinnorhea. Mouth/Throat: Mucous membranes are moist.  Oropharynx non-erythematous. Neck: No stridor.  No cervical spine tenderness to palpation. Hematological/Lymphatic/Immunilogical: No cervical lymphadenopathy. Cardiovascular: Normal rate, regular rhythm. Grossly normal heart sounds.  Good peripheral circulation. Respiratory: Normal respiratory effort.  No retractions. Lungs CTAB. Gastrointestinal: Soft and nontender. No distention. No abdominal bruits. No CVA tenderness. Musculoskeletal: No lower extremity tenderness nor edema.  No joint effusions. Neurologic:  Normal speech and language. No gross focal neurologic deficits are appreciated. No gait instability. Skin:  Skin is warm, dry and intact. No rash noted. Psychiatric: Mood and affect are normal. Speech and behavior are normal.  ____________________________________________   LABS (all labs ordered are listed, but only abnormal results are displayed)  Labs Reviewed  COMPREHENSIVE METABOLIC PANEL  CBC WITH DIFFERENTIAL/PLATELET   ____________________________________________  EKG   ____________________________________________  RADIOLOGY  Left femoral neck fracture.  I, Joni Reining, personally viewed and evaluated these images (plain radiographs) as part of my medical decision making, as well as reviewing the written report by the radiologist.  ____________________________________________   PROCEDURES  Procedure(s) performed: None  Critical Care  performed: No  ____________________________________________   INITIAL IMPRESSION / ASSESSMENT AND PLAN / ED COURSE  Pertinent labs & imaging results that were available during my care of the patient were reviewed by me and considered in my medical decision making (see chart for  details). Discussed x-ray findings showing a left femoral neck fracture. Patient will be transferred over to the major pending admission. ____________________________________________   FINAL CLINICAL IMPRESSION(S) / ED DIAGNOSES  Final diagnoses:  Preoperative clearance      Joni Reining, PA-C 12/30/15 1759  Joni Reining, PA-C 12/30/15 1955  Gayla Doss, MD 12/31/15 [phone]

## 2015-12-30 NOTE — ED Notes (Signed)
Pt brought in via  Ems with a fall.  Pt was walking dog and pt fell.  Pt has left hip pain, abrasions to right hand and elbows.  Struck head, no loc  Alert.

## 2015-12-30 NOTE — ED Notes (Signed)
Pt moved to 11 hallway.  Report off to [employer].

## 2015-12-30 NOTE — H&P (Addendum)
[facility] [place] at [facility]   PATIENT NAME: Martha Mckinney    MR#:  [id number]  DATE OF BIRTH:  Jul 14, 1951  DATE OF ADMISSION:  12/30/2015  PRIMARY CARE PHYSICIAN: No primary care provider on file.   REQUESTING/REFERRING PHYSICIAN: Inocencio Homes, MD  CHIEF COMPLAINT:   Chief Complaint  Patient presents with  . Hip Pain  . Fall    HISTORY OF PRESENT ILLNESS:  Martha Mckinney  is a 65 y.o. female who presents with femur fracture after fall. Patient states that she was walking her dog, who is rather large, and her dog decided to turn and run the opposite direction. She got tangled in the leash and fell. She was unable to get up and walk on that leg afterwards. She came to the ED and was found to have a left femur fracture. Orthopedic surgery was consulted and will plan for surgical repair. Hospitals were called for admission.  PAST MEDICAL HISTORY:   Past Medical History  Diagnosis Date  . Systemic lupus erythematosus (HCC)   . Anxiety   . Depression   . Thyroid disease     HYPOTHYRIODISM  . Avascular necrosis of bones of both hips (HCC)   . Osteopenia   . Recurrent UTI   . Interstitial cystitis     PAST SURGICAL HISTORY:   Past Surgical History  Procedure Laterality Date  . Cesarean section      X 2  . Tubal ligation    . Total abdominal hysterectomy    . Wisdom tooth extraction    . Cystoscopy    . Urethral dilation      SOCIAL HISTORY:   Social History  Substance Use Topics  . Smoking status: Never Smoker   . Smokeless tobacco: Never Used  . Alcohol Use: No    FAMILY HISTORY:   Family History  Problem Relation Age of Onset  . Cancer Father     LUNG  . Cancer Mother     BREAST  . Alzheimer's disease Mother   . Bipolar disorder Mother   . Cancer Paternal Grandmother     uterine cancer    DRUG ALLERGIES:   Allergies  Allergen Reactions  . Erythromycin Hives  . Gabapentin Other (See Comments)    Reaction:   Psychosis    . Sulfa Antibiotics Hives    MEDICATIONS AT HOME:   Prior to Admission medications   Medication Sig Start Date End Date Taking? Authorizing Provider  acidophilus (RISAQUAD) CAPS capsule Take 2 capsules by mouth at bedtime.   Yes Historical Provider, MD  Biotin 5000 MCG CAPS Take 5,000 mcg by mouth at bedtime.   Yes Historical Provider, MD  clonazePAM (KLONOPIN) 1 MG tablet Take 1 mg by mouth 2 (two) times daily.   Yes Historical Provider, MD  levothyroxine (SYNTHROID, LEVOTHROID) 50 MCG tablet Take 50 mcg by mouth daily before breakfast.    Yes Historical Provider, MD  oxyCODONE (OXY IR/ROXICODONE) 5 MG immediate release tablet Take 5 mg by mouth every 4 (four) hours as needed for severe pain.   Yes Historical Provider, MD  sertraline (ZOLOFT) 100 MG tablet Take 200 mg by mouth at bedtime.    Yes Historical Provider, MD  topiramate (TOPAMAX) 100 MG tablet Take 100-200 mg by mouth 2 (two) times daily. Pt takes one tablet in the morning and two at bedtime.   Yes Historical Provider, MD  zolpidem (AMBIEN) 10 MG tablet Take 10 mg by mouth at bedtime.  Yes Historical Provider, MD    REVIEW OF SYSTEMS:  Review of Systems  Constitutional: Negative for fever, chills, weight loss and malaise/fatigue.  HENT: Negative for ear pain, hearing loss and tinnitus.   Eyes: Negative for blurred vision, double vision, pain and redness.  Respiratory: Negative for cough, hemoptysis and shortness of breath.   Cardiovascular: Negative for chest pain, palpitations, orthopnea and leg swelling.  Gastrointestinal: Negative for nausea, vomiting, abdominal pain, diarrhea and constipation.  Genitourinary: Negative for dysuria, frequency and hematuria.  Musculoskeletal: Positive for joint pain (left hip). Negative for back pain and neck pain.  Skin:       No acne, rash, or lesions  Neurological: Negative for dizziness, tremors, focal weakness and weakness.  Endo/Heme/Allergies: Negative for  polydipsia. Does not bruise/bleed easily.  Psychiatric/Behavioral: Negative for depression. The patient is not nervous/anxious and does not have insomnia.      VITAL SIGNS:   Filed Vitals:   12/30/15 1555  BP: 93/42  Pulse: 62  Temp: 98 F (36.7 C)  TempSrc: Oral  Resp: 18  Height: 5' (1.524 m)  Weight: 44.906 kg (99 lb)  SpO2: 99%   Wt Readings from Last 3 Encounters:  12/30/15 44.906 kg (99 lb)  08/29/13 44.997 kg (99 lb 3.2 oz)  01/28/12 46.72 kg (103 lb)    PHYSICAL EXAMINATION:  Physical Exam  Vitals reviewed. Constitutional: She is oriented to person, place, and time. She appears well-developed and well-nourished. No distress.  HENT:  Head: Normocephalic and atraumatic.  Mouth/Throat: Oropharynx is clear and moist.  Eyes: Conjunctivae and EOM are normal. Pupils are equal, round, and reactive to light. No scleral icterus.  Neck: Normal range of motion. Neck supple. No JVD present. No thyromegaly present.  Cardiovascular: Normal rate, regular rhythm and intact distal pulses.  Exam reveals no gallop and no friction rub.   No murmur heard. Respiratory: Effort normal and breath sounds normal. No respiratory distress. She has no wheezes. She has no rales.  GI: Soft. Bowel sounds are normal. She exhibits no distension. There is no tenderness.  Musculoskeletal: Normal range of motion. She exhibits tenderness (Left hip). She exhibits no edema.  No arthritis, no gout  Lymphadenopathy:    She has no cervical adenopathy.  Neurological: She is alert and oriented to person, place, and time. No cranial nerve deficit.  No dysarthria, no aphasia  Skin: Skin is warm and dry. No rash noted. No erythema.  Psychiatric: She has a normal mood and affect. Her behavior is normal. Judgment and thought content normal.    LABORATORY PANEL:   CBC  Recent Labs Lab 12/30/15 1752  WBC 8.4  HGB 14.2  HCT 42.3  PLT 202    ------------------------------------------------------------------------------------------------------------------  Chemistries   Recent Labs Lab 12/30/15 1752  NA 142  K 3.7  CL 114*  CO2 23  GLUCOSE 86  BUN 12  CREATININE 0.97  CALCIUM 9.4  AST 18  ALT 15  ALKPHOS 49  BILITOT 0.2*   ------------------------------------------------------------------------------------------------------------------  Cardiac Enzymes No results for input(s): TROPONINI in the last 168 hours. ------------------------------------------------------------------------------------------------------------------  RADIOLOGY:  Dg Chest 1 View  12/30/2015  CLINICAL DATA:  Preop clearance EXAM: CHEST 1 VIEW COMPARISON:  09/06/2011 FINDINGS: The heart size and mediastinal contours are within normal limits. Both lungs are clear. The visualized skeletal structures are unremarkable. IMPRESSION: No active disease. Electronically Signed   By: Charlett Nose M.D.   On: 12/30/2015 17:41   Dg Hip Unilat With Pelvis 2-3 Views Left  12/30/2015  CLINICAL DATA:  Left hip pain after falling today. EXAM: DG HIP (WITH OR WITHOUT PELVIS) 2-3V LEFT COMPARISON:  None. FINDINGS: The bones appear mildly demineralized. There is a mildly impacted subcapital fracture of the left femoral neck. No evidence of dislocation or acute pelvic fracture. The right femoral neck appears intact. IMPRESSION: Mildly impacted acute subcapital fracture of the left femoral neck. Electronically Signed   By: Carey Bullocks M.D.   On: 12/30/2015 17:32    EKG:   Orders placed or performed in visit on 09/02/11  . EKG 12-Lead    IMPRESSION AND PLAN:  Principal Problem:   Femur fracture, left (HCC) - surgical repair per orthopedic surgery. Pain control. Cardiac risk stratification as below. Patient has no risk factors.   Active Problems:   Anxiety - home meds   Depression - home meds   Hypothyroidism - home dose thyroid replacement  All the  records are reviewed and case discussed with ED provider. Management plans discussed with the patient and/or family.  DVT PROPHYLAXIS: Mechanical only  GI PROPHYLAXIS: None  ADMISSION STATUS: Inpatient  CODE STATUS: Full Code Status History    This patient does not have a recorded code status. Please follow your organizational policy for patients in this situation.      TOTAL TIME TAKING CARE OF THIS PATIENT: 45 minutes.    Aaliah Jorgenson FIELDING 12/30/2015, 8:39 PM  [facility]  Office  [phone]  CC: Primary care physician; No primary care provider on file.

## 2015-12-30 NOTE — ED Provider Notes (Signed)
-----------------------------------------   7:27 PM on 12/30/2015 -----------------------------------------  Patient was transferred to the main side of the ER for continued care. Briefly, this is a 65 year old female who suffered a left subcapital femur fracture after mechanical fall. She is neurovascularly intact. Case discussed with Dr. Joice Lofts of orthopedic surgery. Case discussed with hospitalist, Dr. Clint Guy, for admission.  Gayla Doss, MD 12/30/15 [phone]

## 2015-12-30 NOTE — ED Notes (Signed)
Per Dr. Joice Lofts pt to wait and have urethral catheter placed at time of operation.

## 2015-12-31 ENCOUNTER — Inpatient Hospital Stay: Payer: Medicare Other

## 2015-12-31 ENCOUNTER — Encounter: Payer: Self-pay | Admitting: *Deleted

## 2015-12-31 ENCOUNTER — Inpatient Hospital Stay: Payer: Medicare Other | Admitting: Anesthesiology

## 2015-12-31 ENCOUNTER — Encounter
Admission: EM | Disposition: A | Discharge status: Home / Self Care | Payer: Self-pay | Source: Home / Self Care | Attending: Internal Medicine

## 2015-12-31 HISTORY — PX: HIP PINNING,CANNULATED: SHX1758

## 2015-12-31 LAB — BASIC METABOLIC PANEL
Anion gap: 4 — ABNORMAL LOW (ref 5–15)
BUN: 12 mg/dL (ref 6–20)
CALCIUM: 9.2 mg/dL (ref 8.9–10.3)
CO2: 22 mmol/L (ref 22–32)
CREATININE: 0.78 mg/dL (ref 0.44–1.00)
Chloride: 114 mmol/L — ABNORMAL HIGH (ref 101–111)
Glucose, Bld: 159 mg/dL — ABNORMAL HIGH (ref 65–99)
Potassium: 3.7 mmol/L (ref 3.5–5.1)
Sodium: 140 mmol/L (ref 135–145)

## 2015-12-31 LAB — PROTIME-INR
INR: 1.11
PROTHROMBIN TIME: 14.5 s (ref 11.4–15.0)

## 2015-12-31 LAB — CBC
HCT: 43.4 % (ref 35.0–47.0)
Hemoglobin: 14.2 g/dL (ref 12.0–16.0)
MCH: 31.1 pg (ref 26.0–34.0)
MCHC: 32.7 g/dL (ref 32.0–36.0)
MCV: 95.1 fL (ref 80.0–100.0)
PLATELETS: 169 10*3/uL (ref 150–440)
RBC: 4.56 MIL/uL (ref 3.80–5.20)
RDW: 15 % — AB (ref 11.5–14.5)
WBC: 7.5 10*3/uL (ref 3.6–11.0)

## 2015-12-31 LAB — MRSA PCR SCREENING: MRSA BY PCR: NEGATIVE

## 2015-12-31 SURGERY — FIXATION, FEMUR, NECK, PERCUTANEOUS, USING SCREW
Anesthesia: Spinal | Site: Hip | Laterality: Left | Wound class: Clean

## 2015-12-31 MED ORDER — MIDAZOLAM HCL 5 MG/5ML IJ SOLN
INTRAMUSCULAR | Status: DC | PRN
  Administered 2015-12-31: 2 mg via INTRAVENOUS

## 2015-12-31 MED ORDER — METOCLOPRAMIDE HCL 5 MG/ML IJ SOLN: 5.0000 mg | Freq: Three times a day (TID) | INTRAMUSCULAR | Status: DC | PRN

## 2015-12-31 MED ORDER — BUPIVACAINE-EPINEPHRINE (PF) 0.5% -1:200000 IJ SOLN
INTRAMUSCULAR | Status: DC | PRN
  Administered 2015-12-31: 20 mL via PERINEURAL

## 2015-12-31 MED ORDER — HYDROMORPHONE HCL 1 MG/ML IJ SOLN: 0.5000 mg | INTRAMUSCULAR | Status: DC | PRN

## 2015-12-31 MED ORDER — FENTANYL CITRATE (PF) 100 MCG/2ML IJ SOLN
INTRAMUSCULAR | Status: DC | PRN
  Administered 2015-12-31: 50 ug via INTRAVENOUS

## 2015-12-31 MED ORDER — METOCLOPRAMIDE HCL 10 MG PO TABS: 5.0000 mg | ORAL_TABLET | Freq: Three times a day (TID) | ORAL | Status: DC | PRN

## 2015-12-31 MED ORDER — KCL IN DEXTROSE-NACL 20-5-0.9 MEQ/L-%-% IV SOLN
INTRAVENOUS | Status: DC
  Administered 2015-12-31 – 2016-01-01 (×2): via INTRAVENOUS
  Filled 2015-12-31 (×4): qty 1000

## 2015-12-31 MED ORDER — BUPIVACAINE HCL (PF) 0.5 % IJ SOLN
INTRAMUSCULAR | Status: DC | PRN
  Administered 2015-12-31: 3 mL

## 2015-12-31 MED ORDER — KETOROLAC TROMETHAMINE 30 MG/ML IJ SOLN
INTRAMUSCULAR | Status: AC
  Filled 2015-12-31: qty 1

## 2015-12-31 MED ORDER — PROPOFOL 500 MG/50ML IV EMUL
INTRAVENOUS | Status: DC | PRN
  Administered 2015-12-31: 75 ug/kg/min via INTRAVENOUS

## 2015-12-31 MED ORDER — FLEET ENEMA 7-19 GM/118ML RE ENEM: 1.0000 | ENEMA | Freq: Once | RECTAL | Status: DC | PRN

## 2015-12-31 MED ORDER — KETOROLAC TROMETHAMINE 30 MG/ML IJ SOLN
30.0000 mg | Freq: Once | INTRAMUSCULAR | Status: AC
  Administered 2015-12-31: 30 mg via INTRAVENOUS

## 2015-12-31 MED ORDER — KETOROLAC TROMETHAMINE 15 MG/ML IJ SOLN
15.0000 mg | Freq: Four times a day (QID) | INTRAMUSCULAR | Status: AC
  Administered 2015-12-31 – 2016-01-01 (×4): 15 mg via INTRAVENOUS
  Filled 2015-12-31 (×4): qty 1

## 2015-12-31 MED ORDER — BUPIVACAINE-EPINEPHRINE (PF) 0.5% -1:200000 IJ SOLN
INTRAMUSCULAR | Status: AC
  Filled 2015-12-31: qty 30

## 2015-12-31 MED ORDER — EPHEDRINE SULFATE 50 MG/ML IJ SOLN
INTRAMUSCULAR | Status: DC | PRN
  Administered 2015-12-31: 10 mg via INTRAVENOUS

## 2015-12-31 MED ORDER — PANTOPRAZOLE SODIUM 40 MG PO TBEC
40.0000 mg | DELAYED_RELEASE_TABLET | Freq: Every day | ORAL | Status: DC
  Administered 2015-12-31 – 2016-01-01 (×2): 40 mg via ORAL
  Filled 2015-12-31 (×2): qty 1

## 2015-12-31 MED ORDER — OXYCODONE HCL 5 MG PO TABS
5.0000 mg | ORAL_TABLET | ORAL | Status: DC | PRN
  Administered 2015-12-31 (×2): 5 mg via ORAL
  Administered 2016-01-01 (×2): 10 mg via ORAL
  Filled 2015-12-31: qty 2
  Filled 2015-12-31 (×2): qty 1
  Filled 2015-12-31: qty 2

## 2015-12-31 MED ORDER — ACETAMINOPHEN 500 MG PO TABS
1000.0000 mg | ORAL_TABLET | Freq: Four times a day (QID) | ORAL | Status: AC
  Administered 2015-12-31 – 2016-01-01 (×3): 1000 mg via ORAL
  Filled 2015-12-31 (×3): qty 2

## 2015-12-31 MED ORDER — DIPHENHYDRAMINE HCL 12.5 MG/5ML PO ELIX: 12.5000 mg | ORAL_SOLUTION | ORAL | Status: DC | PRN

## 2015-12-31 MED ORDER — FENTANYL CITRATE (PF) 100 MCG/2ML IJ SOLN
25.0000 ug | INTRAMUSCULAR | Status: DC | PRN
  Administered 2015-12-31: 50 ug via INTRAVENOUS
  Administered 2015-12-31 (×2): 25 ug via INTRAVENOUS

## 2015-12-31 MED ORDER — KETAMINE HCL 10 MG/ML IJ SOLN
INTRAMUSCULAR | Status: DC | PRN
  Administered 2015-12-31 (×2): 10 mg via INTRAVENOUS
  Administered 2015-12-31: 20 mg via INTRAVENOUS
  Administered 2015-12-31: 10 mg via INTRAVENOUS

## 2015-12-31 MED ORDER — FENTANYL CITRATE (PF) 100 MCG/2ML IJ SOLN
INTRAMUSCULAR | Status: AC
  Filled 2015-12-31: qty 2

## 2015-12-31 MED ORDER — DOCUSATE SODIUM 100 MG PO CAPS
100.0000 mg | ORAL_CAPSULE | Freq: Two times a day (BID) | ORAL | Status: DC
  Administered 2015-12-31 – 2016-01-01 (×2): 100 mg via ORAL
  Filled 2015-12-31 (×2): qty 1

## 2015-12-31 MED ORDER — BISACODYL 10 MG RE SUPP: 10.0000 mg | Freq: Every day | RECTAL | Status: DC | PRN

## 2015-12-31 MED ORDER — MORPHINE SULFATE (PF) 2 MG/ML IV SOLN
1.0000 mg | INTRAVENOUS | Status: DC | PRN
  Administered 2015-12-31: 2 mg via INTRAVENOUS
  Filled 2015-12-31: qty 1

## 2015-12-31 MED ORDER — CEFAZOLIN SODIUM-DEXTROSE 2-3 GM-% IV SOLR
2.0000 g | Freq: Four times a day (QID) | INTRAVENOUS | Status: AC
  Administered 2015-12-31 – 2016-01-01 (×3): 2 g via INTRAVENOUS
  Filled 2015-12-31 (×3): qty 50

## 2015-12-31 MED ORDER — BUTALBITAL-APAP-CAFFEINE 50-325-40 MG PO TABS
1.0000 | ORAL_TABLET | ORAL | Status: DC | PRN
Start: 2015-12-31 — End: 2015-12-31
  Administered 2015-12-31: 1 via ORAL
  Filled 2015-12-31: qty 1

## 2015-12-31 MED ORDER — PROMETHAZINE HCL 25 MG/ML IJ SOLN: 6.2500 mg | INTRAMUSCULAR | Status: DC | PRN

## 2015-12-31 MED ORDER — ONDANSETRON HCL 4 MG PO TABS: 4.0000 mg | ORAL_TABLET | Freq: Four times a day (QID) | ORAL | Status: DC | PRN

## 2015-12-31 MED ORDER — MORPHINE SULFATE (PF) 2 MG/ML IV SOLN
INTRAVENOUS | Status: AC
  Administered 2015-12-31: 2 mg via INTRAVENOUS
  Filled 2015-12-31: qty 1

## 2015-12-31 MED ORDER — NEOMYCIN-POLYMYXIN B GU 40-200000 IR SOLN
Status: DC | PRN
  Administered 2015-12-31: 2 mL

## 2015-12-31 MED ORDER — ACETAMINOPHEN 650 MG RE SUPP: 650.0000 mg | Freq: Four times a day (QID) | RECTAL | Status: DC | PRN

## 2015-12-31 MED ORDER — NEOMYCIN-POLYMYXIN B GU 40-200000 IR SOLN
Status: AC
  Filled 2015-12-31: qty 2

## 2015-12-31 MED ORDER — ACETAMINOPHEN 325 MG PO TABS: 650.0000 mg | ORAL_TABLET | Freq: Four times a day (QID) | ORAL | Status: DC | PRN

## 2015-12-31 MED ORDER — ONDANSETRON HCL 4 MG/2ML IJ SOLN: 4.0000 mg | Freq: Four times a day (QID) | INTRAMUSCULAR | Status: DC | PRN

## 2015-12-31 MED ORDER — FAMOTIDINE IN NACL 20-0.9 MG/50ML-% IV SOLN
20.0000 mg | Freq: Once | INTRAVENOUS | Status: AC
  Administered 2015-12-31: 20 mg via INTRAVENOUS
  Filled 2015-12-31: qty 50

## 2015-12-31 MED ORDER — LACTATED RINGERS IV SOLN
INTRAVENOUS | Status: DC
Start: 2015-12-31 — End: 2015-12-31
  Administered 2015-12-31: 12:00:00 via INTRAVENOUS

## 2015-12-31 MED ORDER — ENOXAPARIN SODIUM 40 MG/0.4ML ~~LOC~~ SOLN
40.0000 mg | SUBCUTANEOUS | Status: DC
  Administered 2016-01-01: 40 mg via SUBCUTANEOUS
  Filled 2015-12-31: qty 0.4

## 2015-12-31 MED ORDER — MAGNESIUM HYDROXIDE 400 MG/5ML PO SUSP: 30.0000 mL | Freq: Every day | ORAL | Status: DC | PRN

## 2015-12-31 SURGICAL SUPPLY — 33 items
BIT DRILL 5 ACE CANN QC (BIT) ×3 IMPLANT
CANISTER SUCT 1200ML W/VALVE (MISCELLANEOUS) ×3 IMPLANT
CHLORAPREP W/TINT 26ML (MISCELLANEOUS) ×6 IMPLANT
DRAPE STERI IOBAN 125X83 (DRAPES) ×3 IMPLANT
DRSG OPSITE POSTOP 4X6 (GAUZE/BANDAGES/DRESSINGS) ×3 IMPLANT
DRSG TEGADERM 6X8 (GAUZE/BANDAGES/DRESSINGS) IMPLANT
ELECT REM PT RETURN 9FT ADLT (ELECTROSURGICAL) ×3
ELECTRODE REM PT RTRN 9FT ADLT (ELECTROSURGICAL) ×1 IMPLANT
GAUZE PETRO XEROFOAM 1X8 (MISCELLANEOUS) ×3 IMPLANT
GAUZE SPONGE 4X4 12PLY STRL (GAUZE/BANDAGES/DRESSINGS) ×3 IMPLANT
GLOVE BIO SURGEON STRL SZ8 (GLOVE) ×6 IMPLANT
GLOVE INDICATOR 8.0 STRL GRN (GLOVE) ×3 IMPLANT
GOWN STRL REUS W/ TWL LRG LVL3 (GOWN DISPOSABLE) ×1 IMPLANT
GOWN STRL REUS W/ TWL XL LVL3 (GOWN DISPOSABLE) ×1 IMPLANT
GOWN STRL REUS W/TWL LRG LVL3 (GOWN DISPOSABLE) ×2
GOWN STRL REUS W/TWL XL LVL3 (GOWN DISPOSABLE) ×2
NEEDLE FILTER BLUNT 18X 1/2SAF (NEEDLE) ×2
NEEDLE FILTER BLUNT 18X1 1/2 (NEEDLE) ×1 IMPLANT
PACK HIP COMPR (MISCELLANEOUS) ×3 IMPLANT
PIN THREADED GUIDE ACE (PIN) ×9 IMPLANT
SCREW CANN 6.5 65MM (Screw) ×9 IMPLANT
SCREW CANN 6.5 70MM (Screw) IMPLANT
SCREW CANN 6.5 75MM (Screw) IMPLANT
SCREW CANN LG 6.5 FLT 70X22 (Screw) IMPLANT
SCREW CANN LG 6.5 FLT 75X22 (Screw) IMPLANT
STAPLER SKIN PROX 35W (STAPLE) ×3 IMPLANT
STRAP SAFETY BODY (MISCELLANEOUS) ×3 IMPLANT
SUT PROLENE 2 0 FS (SUTURE) ×3 IMPLANT
SUT VIC AB 0 SH 27 (SUTURE) ×3 IMPLANT
SUT VIC AB 2-0 CT1 27 (SUTURE) ×4
SUT VIC AB 2-0 CT1 TAPERPNT 27 (SUTURE) ×2 IMPLANT
SYR 5ML LL (SYRINGE) ×3 IMPLANT
TAPE MICROFOAM 4IN (TAPE) ×3 IMPLANT

## 2015-12-31 NOTE — Transfer of Care (Signed)
Immediate Anesthesia Transfer of Care Note  Patient: Martha Mckinney  Procedure(s) Performed: Procedure(s): CANNULATED HIP PINNING (Left)  Patient Location: PACU  Anesthesia Type:Spinal  Level of Consciousness: awake, alert  and oriented  Airway & Oxygen Therapy: Patient Spontanous Breathing and Patient connected to face mask oxygen  Post-op Assessment: Report given to RN and Post -op Vital signs reviewed and stable  Post vital signs: Reviewed and stable  Last Vitals: 1449 - 100% sat 91/50 bp 63 hr 16resp  Filed Vitals:   12/31/15 0721 12/31/15 1210  BP: 104/63 102/65  Pulse: 70 67  Temp: 36.3 C 36.5 C  Resp: 18 14    Complications: No apparent anesthesia complications

## 2015-12-31 NOTE — Care Management Note (Addendum)
Case Management Note  Patient Details  Name: Martha Mckinney RECORD MRN: [id number] Date of Birth: 08-29-1951  Subjective/Objective:                  Met with patient and her husband to discuss discharge planning prior to surgery. She would like to go home at discharge and her husband agrees. Her husband said he can get her a bedside commode and rolling walker from a friend. I asked him to bring in walker as patient appears petite. She has never needed home health in the past as she was independent with daily activities- walking her dog. She uses [employer] [facility]  [phone].   Action/Plan: List of home health agencies left with patient. RNCM will continue to follow for Lovenox, DME, and home health.   Expected Discharge Date:                  Expected Discharge Plan:     In-House Referral:     Discharge planning Services  CM Consult  Post Acute Care Choice:  Home Health Choice offered to:  Patient  DME Arranged:    DME Agency:     HH Arranged:    [place] Agency:     Status of Service:  In process, will continue to follow  Medicare Important Message Given:    Date Medicare IM Given:    Medicare IM give by:    Date Additional Medicare IM Given:    Additional Medicare Important Message give by:     If discussed at [place] of Stay Meetings, dates discussed:    Additional Comments: [facility]   AMarshell Garfinkel RN 12/31/2015, 8:57 AM

## 2015-12-31 NOTE — Progress Notes (Signed)
[facility] - [place] at [facility]   PATIENT NAME: Martha Mckinney    MR#:  [id number]  DATE OF BIRTH:  10-07-1951  SUBJECTIVE:  Patient is doing fairly well this point. She has a migraine headache. She usually gets migraines and they become cluster migraine headaches.  REVIEW OF SYSTEMS:    Review of Systems  Constitutional: Negative for fever, chills and malaise/fatigue.  HENT: Negative for sore throat.   Eyes: Negative for blurred vision.  Respiratory: Negative for cough, hemoptysis, shortness of breath and wheezing.   Cardiovascular: Negative for chest pain, palpitations and leg swelling.  Gastrointestinal: Negative for nausea, vomiting, abdominal pain, diarrhea and blood in stool.  Genitourinary: Negative for dysuria.  Musculoskeletal: Positive for joint pain. Negative for back pain.  Neurological: Positive for headaches. Negative for dizziness and tremors.  Endo/Heme/Allergies: Does not bruise/bleed easily.    Tolerating Diet: Nothing by mouth      DRUG ALLERGIES:   Allergies  Allergen Reactions  . Erythromycin Hives  . Gabapentin Other (See Comments)    Reaction:  Psychosis    . Sulfa Antibiotics Hives    VITALS:  Blood pressure 104/63, pulse 70, temperature 97.3 F (36.3 C), temperature source Oral, resp. rate 18, height 5' (1.524 m), weight 44.906 kg (99 lb), SpO2 100 %.  PHYSICAL EXAMINATION:   Physical Exam  Constitutional: She is oriented to person, place, and time and well-developed, well-nourished, and in no distress. No distress.  HENT:  Head: Normocephalic.  Eyes: No scleral icterus.  Neck: Normal range of motion. Neck supple. No JVD present. No tracheal deviation present.  Cardiovascular: Normal rate, regular rhythm and normal heart sounds.  Exam reveals no gallop and no friction rub.   No murmur heard. Pulmonary/Chest: Effort normal and breath sounds normal. No respiratory distress. She has no wheezes. She has no  rales. She exhibits no tenderness.  Abdominal: Soft. Bowel sounds are normal. She exhibits no distension and no mass. There is no tenderness. There is no rebound and no guarding.  Musculoskeletal: Normal range of motion. She exhibits no edema.  Left hip tender and leg shorter  Neurological: She is alert and oriented to person, place, and time.  Skin: Skin is warm. No rash noted. No erythema.  Psychiatric: Affect and judgment normal.      LABORATORY PANEL:   CBC  Recent Labs Lab 12/31/15 0511  WBC 7.5  HGB 14.2  HCT 43.4  PLT 169   ------------------------------------------------------------------------------------------------------------------  Chemistries   Recent Labs Lab 12/30/15 1752 12/31/15 0511  NA 142 140  K 3.7 3.7  CL 114* 114*  CO2 23 22  GLUCOSE 86 159*  BUN 12 12  CREATININE 0.97 0.78  CALCIUM 9.4 9.2  AST 18  --   ALT 15  --   ALKPHOS 49  --   BILITOT 0.2*  --    ------------------------------------------------------------------------------------------------------------------  Cardiac Enzymes No results for input(s): TROPONINI in the last 168 hours. ------------------------------------------------------------------------------------------------------------------  RADIOLOGY:  Dg Chest 1 View  12/30/2015  CLINICAL DATA:  Preop clearance EXAM: CHEST 1 VIEW COMPARISON:  09/06/2011 FINDINGS: The heart size and mediastinal contours are within normal limits. Both lungs are clear. The visualized skeletal structures are unremarkable. IMPRESSION: No active disease. Electronically Signed   By: Charlett Nose M.D.   On: 12/30/2015 17:41   Dg Hip Unilat With Pelvis 2-3 Views Left  12/30/2015  CLINICAL DATA:  Left hip pain after falling today. EXAM: DG HIP (WITH OR WITHOUT PELVIS) 2-3V  LEFT COMPARISON:  None. FINDINGS: The bones appear mildly demineralized. There is a mildly impacted subcapital fracture of the left femoral neck. No evidence of dislocation or acute  pelvic fracture. The right femoral neck appears intact. IMPRESSION: Mildly impacted acute subcapital fracture of the left femoral neck. Electronically Signed   By: Carey Bullocks M.D.   On: 12/30/2015 17:32     ASSESSMENT AND PLAN:    65 year old female with a history of lupus and hypothyroidism who presented after a fall and has suffered a left hip fracture.  1. Mildly impacted acute subcapital fracture left femoral neck: Patient is scheduled for OR. Patient has no risk factors for cardiac event and should do well after surgery. DVT prophylaxis per ORTHO. Continue pain control. PT evaluation after surgery.  2. Hypothyroidism: Continue Synthroid   3. Anxiety and depression: Continue clonazepam and Zoloft.  4. Migraine headache: Continue when necessary pain medications.    Management plans discussed with the patient and she is in agreement.  CODE STATUS: FULL  TOTAL TIME TAKING CARE OF THIS PATIENT: 30 minutes.     POSSIBLE D/C 2 days, DEPENDING ON CLINICAL CONDITION.   [name] M.D on 12/31/2015 at 10:21 AM  Between 7am to 6pm - Pager - [phone] After 6pm go to [email or web address] - password [email or web address] [facility]  [facility]  Office  [phone]  CC: Primary care physician; No primary care provider on file.  Note: This dictation was prepared with Dragon dictation along with smaller phrase technology. Any transcriptional errors that result from this process are unintentional.

## 2015-12-31 NOTE — Consult Note (Signed)
ORTHOPAEDIC CONSULTATION  REQUESTING PHYSICIAN: Adrian Saran, MD  Chief Complaint:   Left hip pain.  History of Present Illness: Martha Mckinney is a 65 y.o. female with a history of anxiety/depression and hypothyroidism who lives with her husband apparently was out walking her dog last evening. The daughter became distracted by another dog and bolted after the other dog, pulling the patient down onto her left side and causing her to injure her left hip. She was unable to get up, so the EMS services were called and she was brought to the emergency room where x-rays demonstrated a valgus impacted left femoral neck fracture. The patient notes that she did bump her head on the ground when she fell, but did not lose consciousness. She denies any other injuries resulting from the fall, nor did she note any lightheadedness, dizziness, shortness of breath, chest pain, or other symptoms that may precipitated her fall. She denies any numbness or paresthesias down her leg.  Past Medical History  Diagnosis Date  . Systemic lupus erythematosus (HCC)   . Anxiety   . Depression   . Thyroid disease     HYPOTHYRIODISM  . Avascular necrosis of bones of both hips (HCC)   . Osteopenia   . Recurrent UTI   . Interstitial cystitis    Past Surgical History  Procedure Laterality Date  . Cesarean section      X 2  . Tubal ligation    . Total abdominal hysterectomy    . Wisdom tooth extraction    . Cystoscopy    . Urethral dilation     Social History   Social History  . Marital Status: Married    Spouse Name: N/A  . Number of Children: N/A  . Years of Education: N/A   Social History Main Topics  . Smoking status: Never Smoker   . Smokeless tobacco: Never Used  . Alcohol Use: No  . Drug Use: No  . Sexual Activity:    Partners: Male    Birth Control/ Protection: Surgical   Other Topics Concern  . None   Social History Narrative    Family History  Problem Relation Age of Onset  . Cancer Father     LUNG  . Cancer Mother     BREAST  . Alzheimer's disease Mother   . Bipolar disorder Mother   . Cancer Paternal Grandmother     uterine cancer   Allergies  Allergen Reactions  . Erythromycin Hives  . Gabapentin Other (See Comments)    Reaction:  Psychosis    . Sulfa Antibiotics Hives   Prior to Admission medications   Medication Sig Start Date End Date Taking? Authorizing Provider  acidophilus (RISAQUAD) CAPS capsule Take 2 capsules by mouth at bedtime.   Yes Historical Provider, MD  Biotin 5000 MCG CAPS Take 5,000 mcg by mouth at bedtime.   Yes Historical Provider, MD  clonazePAM (KLONOPIN) 1 MG tablet Take 1 mg by mouth 2 (two) times daily.   Yes Historical Provider, MD  levothyroxine (SYNTHROID, LEVOTHROID) 50 MCG tablet Take 50 mcg by mouth daily before breakfast.    Yes Historical Provider, MD  oxyCODONE (OXY IR/ROXICODONE) 5 MG immediate release tablet Take 5 mg by mouth every 4 (four) hours as needed for severe pain.   Yes Historical Provider, MD  sertraline (ZOLOFT) 100 MG tablet Take 200 mg by mouth at bedtime.    Yes Historical Provider, MD  topiramate (TOPAMAX) 100 MG tablet Take 100-200 mg by mouth 2 (  two) times daily. Pt takes one tablet in the morning and two at bedtime.   Yes Historical Provider, MD  zolpidem (AMBIEN) 10 MG tablet Take 10 mg by mouth at bedtime.    Yes Historical Provider, MD   Dg Chest 1 View  12/30/2015  CLINICAL DATA:  Preop clearance EXAM: CHEST 1 VIEW COMPARISON:  09/06/2011 FINDINGS: The heart size and mediastinal contours are within normal limits. Both lungs are clear. The visualized skeletal structures are unremarkable. IMPRESSION: No active disease. Electronically Signed   By: Charlett Nose M.D.   On: 12/30/2015 17:41   Dg Hip Unilat With Pelvis 2-3 Views Left  12/30/2015  CLINICAL DATA:  Left hip pain after falling today. EXAM: DG HIP (WITH OR WITHOUT PELVIS) 2-3V LEFT  COMPARISON:  None. FINDINGS: The bones appear mildly demineralized. There is a mildly impacted subcapital fracture of the left femoral neck. No evidence of dislocation or acute pelvic fracture. The right femoral neck appears intact. IMPRESSION: Mildly impacted acute subcapital fracture of the left femoral neck. Electronically Signed   By: Carey Bullocks M.D.   On: 12/30/2015 17:32    Positive ROS: All other systems have been reviewed and were otherwise negative with the exception of those mentioned in the HPI and as above.  Physical Exam: General:  Alert, no acute distress Psychiatric:  Patient is competent for consent with normal mood and affect   Cardiovascular:  No pedal edema Respiratory:  No wheezing, non-labored breathing GI:  Abdomen is soft and non-tender Skin:  No lesions in the area of chief complaint Neurologic:  Sensation intact distally Lymphatic:  No axillary or cervical lymphadenopathy  Orthopedic Exam:  Orthopedic examination is limited to the left hip and lower extremity. Skin inspection around the left hip is unremarkable. She has mild tenderness to palpation over the lateral aspect of the hip. She has more moderate pain with any attempted active or passive motion of the hip or lower extremity. She is able to actively dorsiflex and plantar flex her toes and ankle. She is neurovascularly intact to her left foot and lower leg. She has excellent capillary refill to her foot.  X-rays:  X-rays of the pelvis and left hip are available for review. The findings are as described above. No significant degenerative changes are noted.  Assessment: Valgus impacted left femoral neck fracture.  Plan: The treatment options are discussed with the patient and her husband. Given the findings on x-ray and her overall good health and activity, I feel that it is in her best interest to proceed with an in situ cannulated screw fixation of the valgus impacted left femoral neck fracture. This  procedure has been discussed in detail, as have the potential risks (including bleeding, infection, nerve and/or blood vessel injury, persistent or recurrent pain, stiffness, malunion and/or nonunion, development of arthritis, need for further surgery, blood clots, strokes, heart attacks and/or arrhythmias, etc.) and benefits. The patient states her understanding and wishes to proceed. A formal written consent will be obtained by the nursing staff.  Thank you for asking me to participate in the care of this most pleasant woman. I will be happy to follow her with you.   Maryagnes Amos, MD  Beeper #:  [phone]  12/31/2015 1:15 PM

## 2015-12-31 NOTE — Anesthesia Preprocedure Evaluation (Signed)
Anesthesia Evaluation  Patient identified by MRN, date of birth, ID band Patient awake    Reviewed: Allergy & Precautions, H&P , NPO status , Patient's Chart, lab work & pertinent test results, reviewed documented beta blocker date and time   History of Anesthesia Complications (+) AWARENESS UNDER ANESTHESIA and history of anesthetic complications  Airway Mallampati: II  TM Distance: >3 FB Neck ROM: full    Dental no notable dental hx. (+) Missing, Caps, Poor Dentition, Dental Advidsory Given   Pulmonary neg pulmonary ROS,    Pulmonary exam normal breath sounds clear to auscultation       Cardiovascular Exercise Tolerance: Good negative cardio ROS Normal cardiovascular exam Rhythm:regular Rate:Normal     Neuro/Psych PSYCHIATRIC DISORDERS (Depression and anxiety) negative neurological ROS     GI/Hepatic Neg liver ROS, GERD  ,  Endo/Other  neg diabetesHypothyroidism   Renal/GU negative Renal ROS  negative genitourinary   Musculoskeletal   Abdominal   Peds  Hematology negative hematology ROS (+)   Anesthesia Other Findings Past Medical History:   Systemic lupus erythematosus (HCC)                           Anxiety                                                      Depression                                                   Thyroid disease                                                Comment:HYPOTHYRIODISM   Avascular necrosis of bones of both hips (HCC)               Osteopenia                                                   Recurrent UTI                                                Interstitial cystitis                                        Reproductive/Obstetrics negative OB ROS                             Anesthesia Physical Anesthesia Plan  ASA: III  Anesthesia Plan: Spinal   Post-op Pain Management:    Induction:   Airway Management Planned:   Additional  Equipment:   Intra-op Plan:  Post-operative Plan:   Informed Consent: I have reviewed the patients History and Physical, chart, labs and discussed the procedure including the risks, benefits and alternatives for the proposed anesthesia with the patient or authorized representative who has indicated his/her understanding and acceptance.   Dental Advisory Given  Plan Discussed with: Anesthesiologist, CRNA and Surgeon  Anesthesia Plan Comments:         Anesthesia Quick Evaluation

## 2015-12-31 NOTE — NC FL2 (Signed)
[place] MEDICAID FL2 LEVEL OF CARE SCREENING TOOL     IDENTIFICATION  Patient Name: Martha Mckinney Birthdate: 1951-01-20 Sex: female Admission Date (Current Location): 12/30/2015  [place] and [place] Number:  Chiropodist and Address:  [facility], [address], [place], [place] [address]      Provider Number: [id number]  Attending Physician Name and Address:  Adrian Saran, MD  Relative Name and Phone Number:       Current Level of Care: Hospital Recommended Level of Care: Skilled Nursing Facility Prior Approval Number:    Date Approved/Denied:   PASRR Number:  [id number])  Discharge Plan: SNF    Current Diagnoses: Patient Active Problem List   Diagnosis Date Noted  . Femur fracture, left (HCC) 12/30/2015  . Anxiety 12/30/2015  . Depression 12/30/2015  . Hypothyroidism 12/30/2015    Orientation RESPIRATION BLADDER Height & Weight     Self, Time, Situation, Place  Normal Continent Weight: 99 lb (44.906 kg) Height:  5' (152.4 cm)  BEHAVIORAL SYMPTOMS/MOOD NEUROLOGICAL BOWEL NUTRITION STATUS   (none)  (none ) Continent Diet (NPO for surgery )  AMBULATORY STATUS COMMUNICATION OF NEEDS Skin   Extensive Assist Verbally Surgical wounds (Incision: Left Hip. )                       Personal Care Assistance Level of Assistance  Bathing, Feeding, Dressing Bathing Assistance: Limited assistance Feeding assistance: Independent Dressing Assistance: Limited assistance     Functional Limitations Info  Sight, Hearing, Speech Sight Info: Adequate Hearing Info: Adequate Speech Info: Adequate    SPECIAL CARE FACTORS FREQUENCY  PT (By licensed PT), OT (By licensed OT)     PT Frequency:  (5) OT Frequency:  (5)            Contractures      Additional Factors Info  Code Status, Allergies Code Status Info:  (Full Code. ) Allergies Info:  (Erythromycin, Gabapentin, Sulfa Antibiotics)           Current Medications  (12/31/2015):  This is the current hospital active medication list Current Facility-Administered Medications  Medication Dose Route Frequency Provider Last Rate Last Dose  . [MAR Hold] acetaminophen (TYLENOL) tablet 650 mg  650 mg Oral Q6H PRN Oralia Manis, MD   650 mg at 12/30/15 2216   Or  . [MAR Hold] acetaminophen (TYLENOL) suppository 650 mg  650 mg Rectal Q6H PRN Oralia Manis, MD      . Mitzi Hansen Hold] butalbital-acetaminophen-caffeine (FIORICET, ESGIC) 50-325-40 MG per tablet 1 tablet  1 tablet Oral Q4H PRN Ihor Austin, MD   1 tablet at 12/31/15 0357  . [MAR Hold] clonazePAM (KLONOPIN) tablet 1 mg  1 mg Oral BID Oralia Manis, MD   Stopped at 12/31/15 1000  . fentaNYL (SUBLIMAZE) 100 MCG/2ML injection           . fentaNYL (SUBLIMAZE) injection 25-50 mcg  25-50 mcg Intravenous Q5 min PRN Lenard Simmer, MD   50 mcg at 12/31/15 1505  . ketorolac (TORADOL) 30 MG/ML injection           . lactated ringers infusion   Intravenous Continuous Lenard Simmer, MD 50 mL/hr at 12/31/15 1217    . [MAR Hold] levothyroxine (SYNTHROID, LEVOTHROID) tablet 50 mcg  50 mcg Oral QAC breakfast Oralia Manis, MD   Stopped at 12/31/15 0800  . [MAR Hold] morphine 2 MG/ML injection 1-2 mg  1-2 mg Intravenous Q4H PRN Adrian Saran, MD  2 mg at 12/31/15 1147  . [MAR Hold] ondansetron (ZOFRAN) tablet 4 mg  4 mg Oral Q6H PRN Oralia Manis, MD       Or  . Mitzi Hansen Hold] ondansetron [facility]) injection 4 mg  4 mg Intravenous Q6H PRN Oralia Manis, MD   4 mg at 12/31/15 1035  . promethazine (PHENERGAN) injection 6.25-12.5 mg  6.25-12.5 mg Intravenous Q15 min PRN Lenard Simmer, MD      . Mitzi Hansen Hold] sertraline (ZOLOFT) tablet 200 mg  200 mg Oral QHS Oralia Manis, MD   200 mg at 12/30/15 2317  . [MAR Hold] topiramate (TOPAMAX) tablet 100 mg  100 mg Oral Daily Oralia Manis, MD   Stopped at 12/31/15 1000  . [MAR Hold] topiramate (TOPAMAX) tablet 200 mg  200 mg Oral QHS Oralia Manis, MD   200 mg at 12/30/15 2317     Discharge  Medications: Please see discharge summary for a list of discharge medications.  Relevant Imaging Results:  Relevant Lab Results:   Additional Information  (SSN: [id number])  Haig Prophet, LCSW

## 2015-12-31 NOTE — Care Management Important Message (Signed)
Important Message  Patient Details  Name: Martha Mckinney MRN: [id number] Date of Birth: 22-Dec-1950   Medicare Important Message Given:  Yes    Berna Bue, RN 12/31/2015, 10:00 AM

## 2015-12-31 NOTE — Anesthesia Procedure Notes (Signed)
Spinal Patient location during procedure: OR Start time: 12/31/2015 1:11 PM End time: 12/31/2015 1:21 PM Staffing Anesthesiologist: KARENZ, ANDREW Resident/CRNA: LOPEZ, IVETTE Performed by: resident/CRNA  Preanesthetic Checklist Completed: patient identified, site marked, surgical consent, pre-op evaluation, timeout performed, IV checked, risks and benefits discussed and monitors and equipment checked Spinal Block Patient position: sitting Prep: Betadine Patient monitoring: heart rate, continuous pulse ox, blood pressure and cardiac monitor Approach: midline Location: L3-4 Injection technique: single-shot Needle Needle type: Whitacre and Introducer  Needle gauge: 24 G Needle length: 9 cm Additional Notes Negative paresthesia. Negative blood return. Positive free-flowing CSF. Expiration date of kit checked and confirmed. Patient tolerated procedure well, without complications.     

## 2015-12-31 NOTE — Op Note (Signed)
12/30/2015 - 12/31/2015  2:43 PM  Patient:   Martha Mckinney  Pre-Op Diagnosis:   Valgus-impacted left femoral neck fracture.  Post-Op Diagnosis:   Same.  Procedure:   In situ cannulated screw fixation of valgus-impacted left femoral neck fracture.  Surgeon:   Maryagnes Amos, MD  Assistant:   Alvester Chou, PA-S  Anesthesia:   Spinal  Findings:   As above.  Complications:   None  EBL:   25 cc  Fluids:   600 cc crystalloid  UOP:   None  TT:   None  Drains:   None  Closure:   Staples  Implants:   Biomet 65 x 6.5 mm cannulated screws (16 mm) 3  Brief Clinical Note:   The patient is a 65 year old female who was pulled down by her dog while walking her dog yesterday afternoon. She presented emergency room where x-rays demonstrated the above-noted findings. The patient has been cleared medically and presents at this time for definitive management of this injury.  Procedure:   The patient was brought into the operating room. After adequate spinal anesthesia was obtained, the patient was lain in the supine position on the fracture table. The uninjured leg was placed in a flexed and abducted position over the well-leg holder while the operative leg was placed in gentle longitudinal traction with some internal rotation. The adequacy of fracture position was verified fluoroscopically in AP and lateral projections before the lateral aspect of the left hip and thigh were prepped with ChloraPrep solution and draped sterilely. Preoperative antibiotics were administered. An approximately 4-5 cm incision was made over the lateral aspect of the lower part of the greater trochanter as verified fluoroscopically. This incision was carried down through the subcutaneous cutaneous tissues to expose the iliotibial band. This was split the length the incision to expose the lateral aspect of the proximal femur at the level of the inferior most part of the greater trochanter. Under fluoroscopic guidance, a  guide wire was drilled up through the femoral neck along the calcar into the femoral head to rest within 5 mm of subchondral bone. Its position was assessed fluoroscopically in AP and lateral projections and found to be excellent. Two additional guide wires were placed in parallel fashion more proximally, anterior and posterior to the original pin in an inverted triangular configuration. Again the position of these pins was verified fluoroscopically in AP, lateral, and oblique projections and found to be excellent. The length of each of these pins was measured before each pin was overdrilled with the appropriate cannulated drill. Each of these screws was inserted and advanced to within 5 mm of subchondral bone. Again the position of each of these pins was assessed fluoroscopically in AP, lateral, and oblique projections, and found to be excellent. Of note, the first 3 screws all were too long, so they were removed and the appropriate length screws inserted.  The wound was copiously irrigated with sterile saline solution before the IT band was reapproximated using 2-0 Vicryl interrupted sutures. The subcutaneous tissues also were closed using 2-0 Vicryl interrupted sutures before the skin was closed using staples. A sterile occlusive dressing was applied to the wound before the patient was transferred back to her hospital bed. The patient was then awakened and returned to the recovery room in satisfactory condition after tolerating the procedure well.

## 2016-01-01 LAB — BASIC METABOLIC PANEL
Anion gap: 6 (ref 5–15)
BUN: 8 mg/dL (ref 6–20)
CHLORIDE: 115 mmol/L — AB (ref 101–111)
CO2: 19 mmol/L — ABNORMAL LOW (ref 22–32)
CREATININE: 0.93 mg/dL (ref 0.44–1.00)
Calcium: 8.4 mg/dL — ABNORMAL LOW (ref 8.9–10.3)
Glucose, Bld: 114 mg/dL — ABNORMAL HIGH (ref 65–99)
Potassium: 3.5 mmol/L (ref 3.5–5.1)
SODIUM: 140 mmol/L (ref 135–145)

## 2016-01-01 LAB — CBC
HCT: 35.4 % (ref 35.0–47.0)
Hemoglobin: 11.8 g/dL — ABNORMAL LOW (ref 12.0–16.0)
MCH: 31.1 pg (ref 26.0–34.0)
MCHC: 33.4 g/dL (ref 32.0–36.0)
MCV: 93 fL (ref 80.0–100.0)
PLATELETS: 149 10*3/uL — AB (ref 150–440)
RBC: 3.81 MIL/uL (ref 3.80–5.20)
RDW: 14.2 % (ref 11.5–14.5)
WBC: 5.1 10*3/uL (ref 3.6–11.0)

## 2016-01-01 MED ORDER — ENOXAPARIN SODIUM 40 MG/0.4ML ~~LOC~~ SOLN: 40.0000 mg | SUBCUTANEOUS | Status: AC

## 2016-01-01 MED ORDER — BUTALBITAL-APAP-CAFFEINE 50-325-40 MG PO TABS
1.0000 | ORAL_TABLET | ORAL | Status: DC | PRN
  Administered 2016-01-01: 1 via ORAL
  Filled 2016-01-01: qty 1

## 2016-01-01 MED ORDER — ZOLPIDEM TARTRATE 5 MG PO TABS
10.0000 mg | ORAL_TABLET | Freq: Every evening | ORAL | Status: DC | PRN
  Administered 2016-01-01: 10 mg via ORAL
  Filled 2016-01-01: qty 2

## 2016-01-01 MED ORDER — BUTALBITAL-APAP-CAFFEINE 50-325-40 MG PO TABS: 1.0000 | ORAL_TABLET | ORAL | Status: AC | PRN

## 2016-01-01 MED ORDER — OXYCODONE HCL 5 MG PO TABS: 5.0000 mg | ORAL_TABLET | ORAL | Status: AC | PRN

## 2016-01-01 NOTE — Progress Notes (Signed)
Clinical Social Worker (CSW) received SNF consult. PT is recommending home health. RN Case Manager is aware of above. Please reconsult if future social work needs arise. CSW signing off.   Eupha Lobb Morgan, LCSW [phone] 

## 2016-01-01 NOTE — Discharge Summary (Signed)
[facility] - [place] at [facility]   PATIENT NAME: Martha Mckinney    MR#:  [id number]  DATE OF BIRTH:  08/30/1951  DATE OF ADMISSION:  12/30/2015 ADMITTING PHYSICIAN: Oralia Manis, MD  DATE OF DISCHARGE: 01/01/2016  PRIMARY CARE PHYSICIAN: No primary care provider on file.    ADMISSION DIAGNOSIS:  Preoperative clearance [Z01.818] Left displaced femoral neck fracture, closed, initial encounter (HCC) [S72.002A]  DISCHARGE DIAGNOSIS:  Principal Problem:   Femur fracture, left (HCC) Active Problems:   Anxiety   Depression   Hypothyroidism   SECONDARY DIAGNOSIS:   Past Medical History  Diagnosis Date  . Systemic lupus erythematosus (HCC)   . Anxiety   . Depression   . Thyroid disease     HYPOTHYRIODISM  . Avascular necrosis of bones of both hips (HCC)   . Osteopenia   . Recurrent UTI   . Interstitial cystitis     HOSPITAL COURSE:   65 year old female with history of lupus and hypothyroidism who had a mechanical fall and suffered a hip fracture. For further details please further H&P.   1. Mildly impacted acute subcapital fracture left femoral neck: Patient is postoperative day #1 and doing quite well after surgery. Patient will be discharged with home health as per physical therapy were conditions. Patient will need Lovenox for 2 weeks postoperatively. Patient will follow-up with orthopedic surgery in 1-2 weeks.   2. Hypothyroidism: Continue Synthroid at discharge   3. Anxiety and depression: Continue clonazepam and Zoloft at discharge  4. Migraine headache: She will continue outpatient medications.   DISCHARGE CONDITIONS AND DIET:   Patient is able to be discharged home in stable condition on a regular diet.  CONSULTS OBTAINED:  Treatment Team:  Christena Flake, MD  DRUG ALLERGIES:   Allergies  Allergen Reactions  . Erythromycin Hives  . Gabapentin Other (See Comments)    Reaction:  Psychosis    . Sulfa Antibiotics Hives     DISCHARGE MEDICATIONS:   Current Discharge Medication List    START taking these medications   Details  enoxaparin (LOVENOX) 40 MG/0.4ML injection Inject 0.4 mLs (40 mg total) into the skin daily. Qty: 0.4 mL, Refills: 0      CONTINUE these medications which have NOT CHANGED   Details  acidophilus (RISAQUAD) CAPS capsule Take 2 capsules by mouth at bedtime.    Biotin 5000 MCG CAPS Take 5,000 mcg by mouth at bedtime.    clonazePAM (KLONOPIN) 1 MG tablet Take 1 mg by mouth 2 (two) times daily.    levothyroxine (SYNTHROID, LEVOTHROID) 50 MCG tablet Take 50 mcg by mouth daily before breakfast.     oxyCODONE (OXY IR/ROXICODONE) 5 MG immediate release tablet Take 5 mg by mouth every 4 (four) hours as needed for severe pain.    sertraline (ZOLOFT) 100 MG tablet Take 200 mg by mouth at bedtime.     topiramate (TOPAMAX) 100 MG tablet Take 100-200 mg by mouth 2 (two) times daily. Pt takes one tablet in the morning and two at bedtime.    zolpidem (AMBIEN) 10 MG tablet Take 10 mg by mouth at bedtime.               Today   CHIEF COMPLAINT:  Patient is doing well. Patient reports that after surgery she was ambulating with a walker yesterday.   VITAL SIGNS:  Blood pressure 120/62, pulse 85, temperature 97.9 F (36.6 C), temperature source Oral, resp. rate 17, height 5' (1.524 m), weight 44.906 kg (  99 lb), SpO2 95 %.   REVIEW OF SYSTEMS:  Review of Systems  Constitutional: Negative for fever, chills and malaise/fatigue.  HENT: Negative for sore throat.   Eyes: Negative for blurred vision.  Respiratory: Negative for cough, hemoptysis, shortness of breath and wheezing.   Cardiovascular: Negative for chest pain, palpitations and leg swelling.  Gastrointestinal: Negative for nausea, vomiting, abdominal pain, diarrhea and blood in stool.  Genitourinary: Negative for dysuria.  Musculoskeletal: Negative for back pain.  Neurological: Negative for dizziness, tremors and  headaches.  Endo/Heme/Allergies: Does not bruise/bleed easily.     PHYSICAL EXAMINATION:  GENERAL:  65 y.o.-year-old patient lying in the bed with no acute distress.  NECK:  Supple, no jugular venous distention. No thyroid enlargement, no tenderness.  LUNGS: Normal breath sounds bilaterally, no wheezing, rales,rhonchi  No use of accessory muscles of respiration.  CARDIOVASCULAR: S1, S2 normal. No murmurs, rubs, or gallops.  ABDOMEN: Soft, non-tender, non-distended. Bowel sounds present. No organomegaly or mass.  EXTREMITIES: No pedal edema, cyanosis, or clubbing.  PSYCHIATRIC: The patient is alert and oriented x 3.  SKIN: No obvious rash, lesion, or ulcer.   DATA REVIEW:   CBC  Recent Labs Lab 01/01/16 0446  WBC 5.1  HGB 11.8*  HCT 35.4  PLT 149*    Chemistries   Recent Labs Lab 12/30/15 1752  01/01/16 0446  NA 142  < > 140  K 3.7  < > 3.5  CL 114*  < > 115*  CO2 23  < > 19*  GLUCOSE 86  < > 114*  BUN 12  < > 8  CREATININE 0.97  < > 0.93  CALCIUM 9.4  < > 8.4*  AST 18  --   --   ALT 15  --   --   ALKPHOS 49  --   --   BILITOT 0.2*  --   --   < > = values in this interval not displayed.  Cardiac Enzymes No results for input(s): TROPONINI in the last 168 hours.  Microbiology Results  @  RADIOLOGY:  Dg Chest 1 View  12/30/2015  CLINICAL DATA:  Preop clearance EXAM: CHEST 1 VIEW COMPARISON:  09/06/2011 FINDINGS: The heart size and mediastinal contours are within normal limits. Both lungs are clear. The visualized skeletal structures are unremarkable. IMPRESSION: No active disease. Electronically Signed   By: Charlett Nose M.D.   On: 12/30/2015 17:41   Dg Hip Operative Unilat W Or W/o Pelvis Left  12/31/2015  CLINICAL DATA:  Hardware placement in the left hip ; 0.46 seconds fluoro time reported. EXAM: OPERATIVE left HIP (WITH PELVIS IF PERFORMED) 1 VIEWS TECHNIQUE: Fluoroscopic spot image(s) were submitted for interpretation post-operatively. COMPARISON:   Left hip series of December 30, 2015 FINDINGS: The patient has undergone placement of 3 screws through the femoral neck into the femoral head for fixation of the subcapital fracture. Alignment is more nearly anatomic. The intertrochanteric and subtrochanteric regions appear normal. The observed portions of the right hemipelvis are normal. IMPRESSION: The patient has screw fixation of the subcapital fracture of the left hip. No immediate postprocedure complication is observed. Electronically Signed   By: David  [place] M.D.   On: 12/31/2015 14:46   Dg Hip Unilat With Pelvis 2-3 Views Left  12/30/2015  CLINICAL DATA:  Left hip pain after falling today. EXAM: DG HIP (WITH OR WITHOUT PELVIS) 2-3V LEFT COMPARISON:  None. FINDINGS: The bones appear mildly demineralized. There is a mildly impacted subcapital fracture of the left  femoral neck. No evidence of dislocation or acute pelvic fracture. The right femoral neck appears intact. IMPRESSION: Mildly impacted acute subcapital fracture of the left femoral neck. Electronically Signed   By: Carey Bullocks M.D.   On: 12/30/2015 17:32      Management plans discussed with the patient and she is in agreement. Stable for discharge t home with home health care  Patient should follow up with Ortho  one week  CODE STATUS:     Code Status Orders        Start     Ordered   12/30/15 2123  Full code   Continuous     12/30/15 2122    Code Status History    Date Active Date Inactive Code Status Order ID Comments User Context   This patient has a current code status but no historical code status.      TOTAL TIME TAKING CARE OF THIS PATIENT: 35 minutes.    Note: This dictation was prepared with Dragon dictation along with smaller phrase technology. Any transcriptional errors that result from this process are unintentional.  Cyndi Montejano M.D on 01/01/2016 at 11:45 AM  Between 7am to 6pm - Pager - [phone] After 6pm go to [email or web address] - password [email or web address]  [facility]  [facility]  Office  [phone]  CC: Primary care physician; No primary care provider on file.

## 2016-01-01 NOTE — Anesthesia Postprocedure Evaluation (Signed)
Anesthesia Post Note  Patient: WANIYA HOGLUND  Procedure(s) Performed: Procedure(s) (LRB): CANNULATED HIP PINNING (Left)  Patient location during evaluation: Other Anesthesia Type: Spinal Level of consciousness: oriented and awake and alert Pain management: pain level controlled Vital Signs Assessment: post-procedure vital signs reviewed and stable Respiratory status: spontaneous breathing, respiratory function stable and patient connected to nasal cannula oxygen Cardiovascular status: blood pressure returned to baseline and stable Postop Assessment: no headache and no backache Anesthetic complications: no    Last Vitals:  Filed Vitals:   01/01/16 0507 01/01/16 0649  BP: 108/60 120/62  Pulse: 65 65  Temp: 36.8 C 36.6 C  Resp: 18 17    Last Pain:  Filed Vitals:   01/01/16 0649  PainSc: 4                  Starling Manns

## 2016-01-01 NOTE — Progress Notes (Signed)
Subjective: 1 Day Post-Op Procedure(s) (LRB): CANNULATED HIP PINNING (Left) Patient reports pain as 5 on 0-10 scale.   Patient is well, and has had no acute complaints or problems Plan is to go Home after hospital stay. Negative for chest pain and shortness of breath Fever: no Gastrointestinal:negative for nausea and vomiting  Objective: Vital signs in last 24 hours: Temp:  [97.4 F (36.3 C)-98.6 F (37 C)] 97.9 F (36.6 C) (02/08 0649) Pulse Rate:  [56-73] 65 (02/08 0649) Resp:  [9-19] 17 (02/08 0649) BP: (81-140)/(45-112) 120/62 mmHg (02/08 0649) SpO2:  [92 %-100 %] 100 % (02/08 0649)  Intake/Output from previous day:  Intake/Output Summary (Last 24 hours) at 01/01/16 0724 Last data filed at 01/01/16 0536  Gross per 24 hour  Intake 2923.25 ml  Output   2550 ml  Net 373.25 ml    Intake/Output this shift:    Labs:  Recent Labs  12/30/15 1752 12/31/15 0511 01/01/16 0446  HGB 14.2 14.2 11.8*    Recent Labs  12/31/15 0511 01/01/16 0446  WBC 7.5 5.1  RBC 4.56 3.81  HCT 43.4 35.4  PLT 169 149*    Recent Labs  12/31/15 0511 01/01/16 0446  NA 140 140  K 3.7 3.5  CL 114* 115*  CO2 22 19*  BUN 12 8  CREATININE 0.78 0.93  GLUCOSE 159* 114*  CALCIUM 9.2 8.4*    Recent Labs  12/31/15 1324  INR 1.11     EXAM General - Patient is Alert, Appropriate and Oriented Extremity - Neurologically intact ABD soft Sensation intact distally Intact pulses distally Dorsiflexion/Plantar flexion intact Incision: dressing C/D/I Dressing/Incision - clean, dry, no drainage Motor Function - intact, moving foot and toes well on exam.   Past Medical History  Diagnosis Date  . Systemic lupus erythematosus (HCC)   . Anxiety   . Depression   . Thyroid disease     HYPOTHYRIODISM  . Avascular necrosis of bones of both hips (HCC)   . Osteopenia   . Recurrent UTI   . Interstitial cystitis    Assessment/Plan: 1 Day Post-Op Procedure(s) (LRB): CANNULATED HIP  PINNING (Left) Principal Problem:   Femur fracture, left (HCC) Active Problems:   Anxiety   Depression   Hypothyroidism  Estimated body mass index is 19.33 kg/(m^2) as calculated from the following:   Height as of this encounter: 5' (1.524 m).   Weight as of this encounter: 44.906 kg (99 lb). Advance diet Up with therapy D/C IV fluids when tolerating PO intake.  Pt was able to get up yesterday following surgery and sit on the edge of her bed.  DVT Prophylaxis - Lovenox, Foot Pumps and TED hose Weight-Bearing as tolerated to left leg  J. Horris Latino, PA-C [facility] Orthopaedic Surgery 01/01/2016, 7:24 AM

## 2016-01-01 NOTE — Progress Notes (Signed)
Physical Therapy Treatment Patient Details Name: Martha Mckinney MRN: [id number] DOB: [birth year] Today's Date: 01/01/2016    History of Present Illness Pt is a 65 y.o. female with PMH of lupus, hypothyroid, osteopenia and avascular necrosis of both hips.  Pt fell and was admitted for L subcapital fracture of the femoral neck.  Pt is s/p L screw fixation on L femoral neck (12-31-15).    PT Comments    Pt was in chair asleep when session began.  Pt stated that she had fallen asleep in the chair because she was tired.  Pt required min guarding for sit to stand with RW.  Pt also ambulated 85 feet with min guarding and RW.  Pt's required verbal cues for stepping and walker placement as well as had decreased cadence at beginning of ambulation.  Verbal cues for step cadence increased pt's ambulation speed to appropriate safe speed for pt.  Pt appeared to be able to maintain PWB'ing status with functional mobility.  Pt's husband verbalizing appropriate assist required for pt's safe discharge home and will be able to provide assist required.   Follow Up Recommendations  Home health PT;Supervision for mobility/OOB     Equipment Recommendations   (Pt has borrowed RW for home use and has a 3 in 1 commode)    Recommendations for Other Services     Precautions / Restrictions Precautions Precautions: Fall Restrictions Weight Bearing Restrictions: Yes LLE Weight Bearing: Partial weight bearing LLE Partial Weight Bearing Percentage or Pounds: <50% body weight    Mobility  Bed Mobility Overal bed mobility: Needs Assistance             General bed mobility comments: supervision.  Transfers Overall transfer level: Needs assistance Equipment used: Rolling walker (2 wheeled) Transfers: Sit to/from Stand Sit to Stand: Min guard            Ambulation/Gait Ambulation/Gait assistance: Min guard Ambulation Distance (Feet): 85 Feet Assistive device: Rolling walker (2 wheeled) Gait  Pattern/deviations: Step-to pattern;Decreased step length - left;Decreased stance time - left;Antalgic Gait velocity: decreased  Verbal cues required for stepping and walker placement as well as verbal cues for ambulation cadence.     Stairs            Wheelchair Mobility    Modified Rankin (Stroke Patients Only)       Balance Overall balance assessment: Needs assistance Sitting-balance support: Feet supported Sitting balance-Leahy Scale: Good     Standing balance support: Bilateral upper extremity supported (RW) Standing balance-Leahy Scale: Good                      Cognition Arousal/Alertness: Awake/alert Behavior During Therapy: WFL for tasks assessed/performed Overall Cognitive Status: Within Functional Limits for tasks assessed                      Exercises    General Comments   Nursing was contacted and cleared pt for physical therapy.  Pt was agreeable and tolerated session well. Pt's husband present during session.       Pertinent Vitals/Pain Pain Assessment: 0-10 Pain Score: 5  Pain Location: L hip and L groin Pain Descriptors / Indicators: Grimacing;Guarding;Operative site guarding Pain Intervention(s): Limited activity within patient's tolerance;Monitored during session;Repositioned  See flow sheet for vitals.     Home Living                      Prior Function   Independent  PT Goals (current goals can now be found in the care plan section) Acute Rehab PT Goals Patient Stated Goal: to go home  PT Goal Formulation: With patient Time For Goal Achievement: 01/15/16 Potential to Achieve Goals: Good Progress towards PT goals: Progressing toward goals    Frequency  BID    PT Plan      Co-evaluation             End of Session Equipment Utilized During Treatment: Gait belt Activity Tolerance: Patient tolerated treatment well Patient left: in bed;with call bell/phone within reach;with bed alarm  set;with SCD's reapplied;with family/visitor present     Time: 6962-9528 PT Time Calculation (min) (ACUTE ONLY): 23 min  Charges:                       G Codes:     Lyndel Safe, SPT Lyndel Safe 01/01/2016, 2:51 PM

## 2016-01-01 NOTE — Care Management (Signed)
Appointment made for OPPT at [facility] [address] Friday 01/03/16 at 0930AM per patient request. Rx copied and delivered to patient to take to [facility]. Rolling walker she owns will work. Lovenox $6. No further RNCM needs. Case closed.

## 2016-01-01 NOTE — Progress Notes (Signed)
Completed discharge instructions with patient and spouse.  Prescriptions given to patient.  No complaints or questions.  Has wheelchair.

## 2016-01-01 NOTE — Discharge Instructions (Signed)

## 2016-01-01 NOTE — Evaluation (Signed)
Physical Therapy Evaluation Patient Details Name: REGENE MCCARTHY MRN: [id number] DOB: [birth year] Today's Date: 01/01/2016   History of Present Illness  Pt is a 65 y.o. female with PMH of lupus, hypothyroid, osteopenia and avascular necrosis of both hips.  Pt fell and was admitted for L subcapital fracture of the femoral neck.  Pt is s/p L screw fixation on L femoral neck (12-31-15).    Clinical Impression  Prior to admission pt was independent.  Pt lives with husband in apartment with ramp and one level. Pt was supervision with bed mobility.  Pt understood and incorporated L hip weight bearing precautions; pt appeared to be able to maintain PWB'ing status during session with intermittent cueing.  Pt required substantial extra time and verbal cueing for ambulation (35 feet) with RW.  Due to aforementioned function and strength deficits, pt is in need of skilled physical therapy.  It is recommended that pt is discharged home with family with standby assist for functional mobility with use of RW and home health PT when medically appropriate.     Follow Up Recommendations Home health PT;Supervision for mobility/OOB    Equipment Recommendations   (Pt has borrowed RW for home use and has a 3 in 1 commode)    Recommendations for Other Services OT consult     Precautions / Restrictions Precautions Precautions: Fall Restrictions Weight Bearing Restrictions: Yes LLE Weight Bearing: Partial weight bearing LLE Partial Weight Bearing Percentage or Pounds: <50% body weight      Mobility  Bed Mobility Overal bed mobility: Needs Assistance             General bed mobility comments: supervision.  Transfers Overall transfer level: Needs assistance Equipment used: Rolling walker (2 wheeled) Transfers: Sit to/from Stand Sit to Stand: Min guard    Pt required vc's for hand and feet placement.        Ambulation/Gait Ambulation/Gait assistance: Min guard Ambulation Distance (Feet): 35  Feet Assistive device: Rolling walker (2 wheeled) Gait Pattern/deviations: Step-to pattern;Decreased step length - left;Decreased stance time - left;Antalgic Gait velocity: decreased    Pt required vc's for stepping technique, PWB'ing status, correct walker use.  Stairs            Wheelchair Mobility    Modified Rankin (Stroke Patients Only)       Balance Overall balance assessment: Needs assistance Sitting-balance support: Feet supported;No upper extremity supported Sitting balance-Leahy Scale: Good     Standing balance support: Bilateral upper extremity supported Standing balance-Leahy Scale: Good                               Pertinent Vitals/Pain Pain Assessment: 0-10 Pain Score: 8  Pain Location: L hip and L groin. Pain Descriptors / Indicators: Operative site guarding;Grimacing;Guarding Pain Intervention(s): Limited activity within patient's tolerance;Monitored during session;Premedicated before session;Repositioned  See flow sheet for vitals.       Home Living Family/patient expects to be discharged to:: Private residence Living Arrangements: Spouse/significant other Available Help at Discharge: Family Type of Home: Apartment Home Access: Ramped entrance     Home Layout: One level Home Equipment: Environmental consultant - 2 wheels      Prior Function Level of Independence: Independent               Hand Dominance        Extremity/Trunk Assessment   Upper Extremity Assessment: Overall WFL for tasks assessed  Lower Extremity Assessment: Generalized weakness      Cervical / Trunk Assessment: Normal  Communication   Communication: No difficulties  Cognition Arousal/Alertness: Awake/alert Behavior During Therapy: WFL for tasks assessed/performed Overall Cognitive Status: Within Functional Limits for tasks assessed                      General Comments   Nursing was contacted and cleared pt for physical therapy.   Pt was agreeable and tolerated session well. Pt's borrowed RW assessed for appropriate fit:  Appears to be appropriate for use for home.     Exercises Total Joint Exercises Ankle Circles/Pumps: AROM;Both;10 reps Quad Sets: AROM;Both;10 reps Gluteal Sets: AROM;Both;10 reps Towel Squeeze: AROM;Both;10 reps Short Arc Quad: AROM;Both;10 reps Heel Slides: AROM;Both;10 reps Hip ABduction/ADduction: AROM;Both;10 reps Straight Leg Raises: AROM;Both;10 reps      Assessment/Plan    PT Assessment Patient needs continued PT services  PT Diagnosis Generalized weakness   PT Problem List Decreased strength;Decreased range of motion;Decreased activity tolerance;Decreased balance;Decreased mobility;Decreased coordination;Pain  PT Treatment Interventions DME instruction;Gait training;Stair training;Functional mobility training;Therapeutic activities;Therapeutic exercise;Patient/family education;Balance training   PT Goals (Current goals can be found in the Care Plan section) Acute Rehab PT Goals Patient Stated Goal: to go home  PT Goal Formulation: With patient Time For Goal Achievement: 01/15/16 Potential to Achieve Goals: Good    Frequency BID   Barriers to discharge        Co-evaluation               End of Session Equipment Utilized During Treatment: Gait belt Activity Tolerance: Patient tolerated treatment well Patient left: in chair;with call bell/phone within reach;with chair alarm set;with family/visitor present;with SCD's reapplied           Time: 0454-0981 PT Time Calculation (min) (ACUTE ONLY): 35 min   Charges:         PT G Codes:       Lyndel Safe, SPT Lyndel Safe 01/01/2016, 10:56 AM

## 2016-04-28 ENCOUNTER — Other Ambulatory Visit: Payer: Self-pay | Admitting: Surgery

## 2016-04-28 DIAGNOSIS — S72002D Fracture of unspecified part of neck of left femur, subsequent encounter for closed fracture with routine healing: Secondary | ICD-10-CM

## 2016-04-28 DIAGNOSIS — M25552 Pain in left hip: Secondary | ICD-10-CM

## 2016-05-04 ENCOUNTER — Ambulatory Visit
Admission: RE | Admit: 2016-05-04 | Discharge: 2016-05-04 | Disposition: A | Discharge status: Home / Self Care | Payer: Medicare Other | Source: Ambulatory Visit | Attending: Surgery | Admitting: Surgery

## 2016-05-04 DIAGNOSIS — X58XXXD Exposure to other specified factors, subsequent encounter: Secondary | ICD-10-CM | POA: Diagnosis not present

## 2016-05-04 DIAGNOSIS — S72002D Fracture of unspecified part of neck of left femur, subsequent encounter for closed fracture with routine healing: Secondary | ICD-10-CM | POA: Insufficient documentation

## 2016-05-04 DIAGNOSIS — M25552 Pain in left hip: Secondary | ICD-10-CM | POA: Diagnosis present

## 2017-10-29 IMAGING — CT CT HIP*L* W/O CM
1 series · 15 of 32 positions shown, 19 images · non-contrast
Comparison: Radiographs from 12/31/2015 and 12/30/2015

CLINICAL DATA: Left hip pain, prior femoral neck fracture with
cannulated lag screw fixation.

EXAM:
CT OF THE LEFT HIP WITHOUT CONTRAST
TECHNIQUE: Multidetector CT imaging of the left hip was performed according to
the standard protocol. Multiplanar CT image reconstructions were
also generated.

[Series 5: axial st · axial · 0.36mm/px · z∈[-910,-734]mm · 15 of 99 slices shown, 19 images]
[im 7/99  soft-tissue]
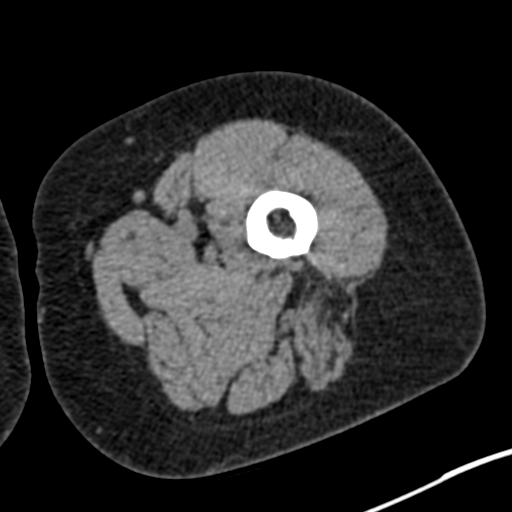
[im 7/99  bone]
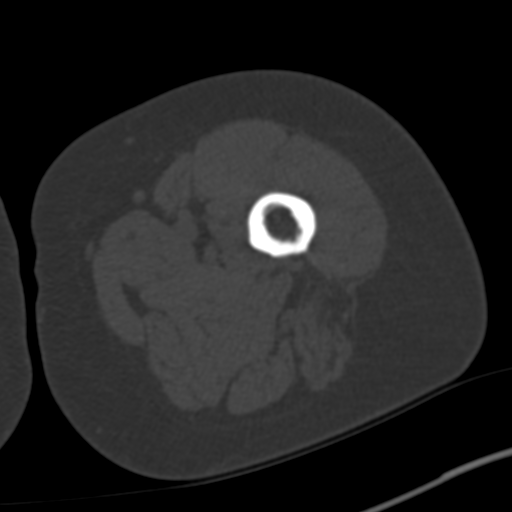
[im 13/99  soft-tissue]
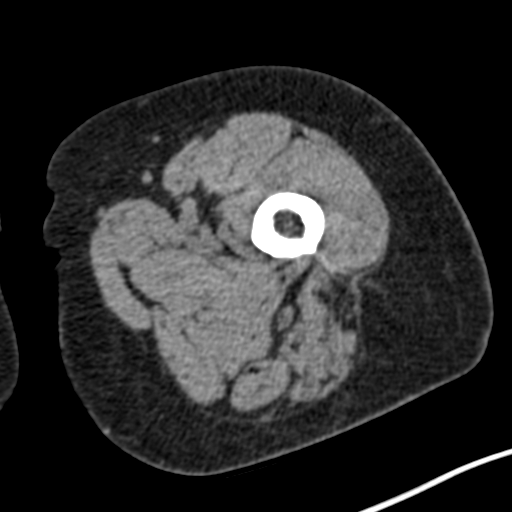
[im 19/99  soft-tissue]
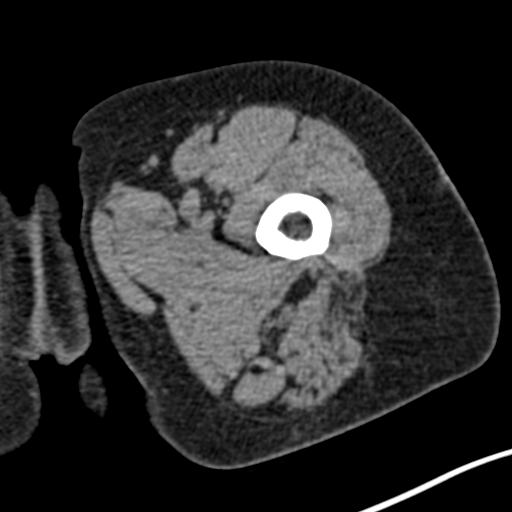
[im 29/99  soft-tissue]
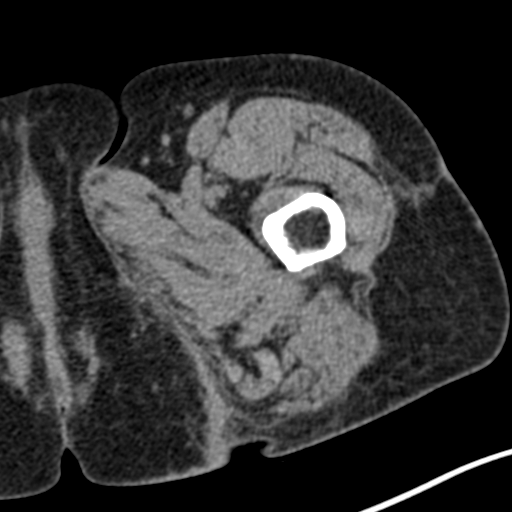
[im 35/99  soft-tissue]
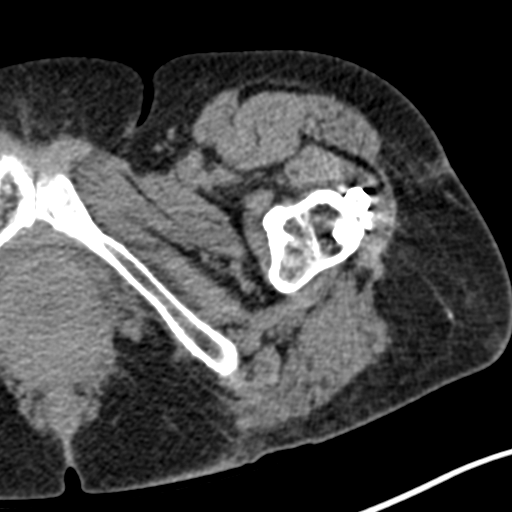
[im 42/99  soft-tissue]
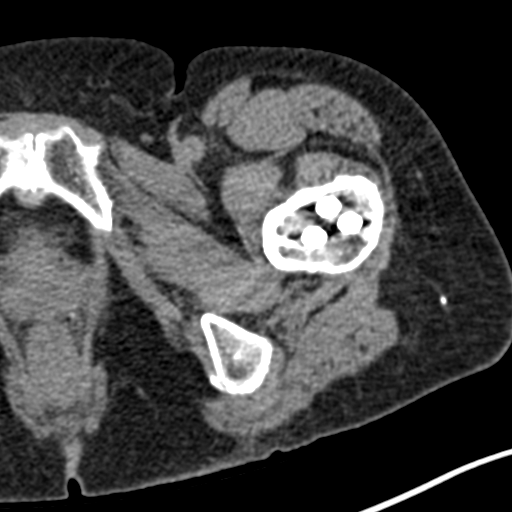
[im 51/99  soft-tissue]
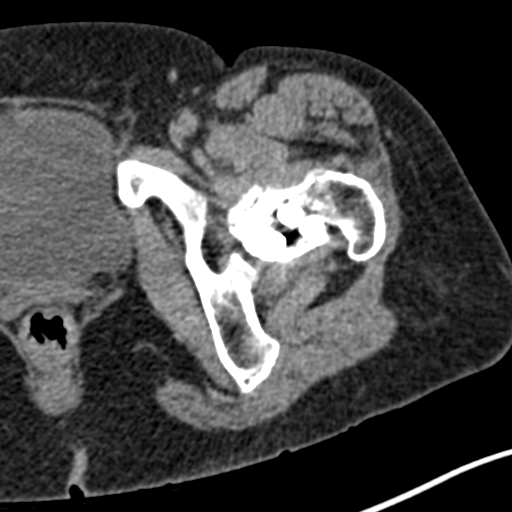
[im 57/99  soft-tissue]
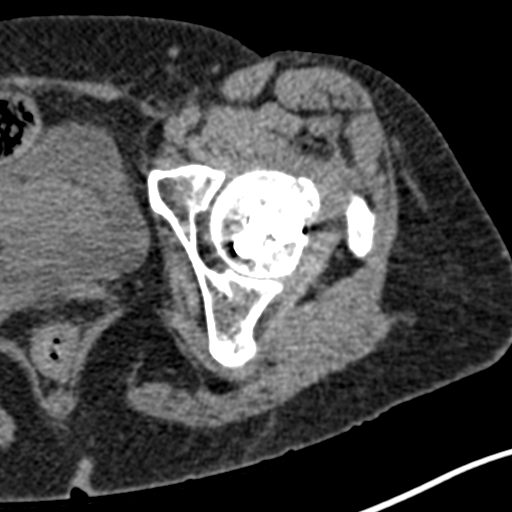
[im 64/99  soft-tissue]
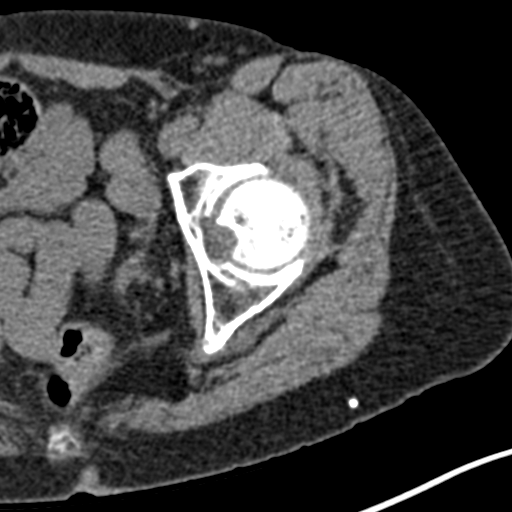
[im 64/99  bone]
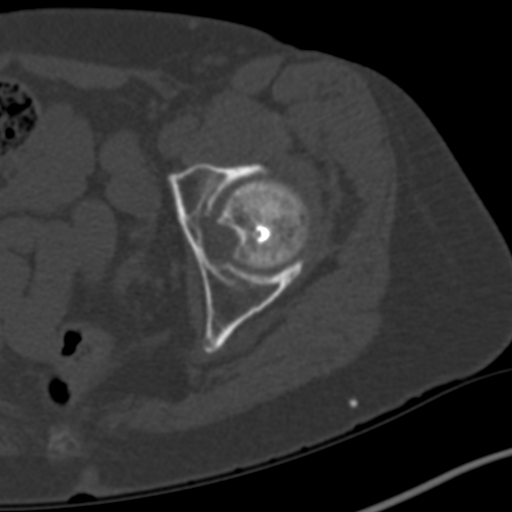
[im 70/99  soft-tissue]
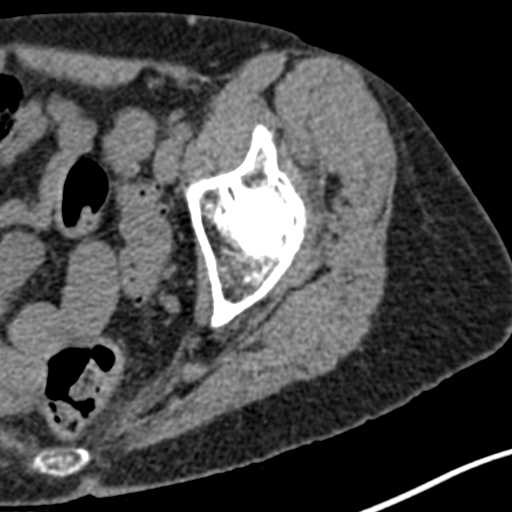
[im 80/99  soft-tissue]
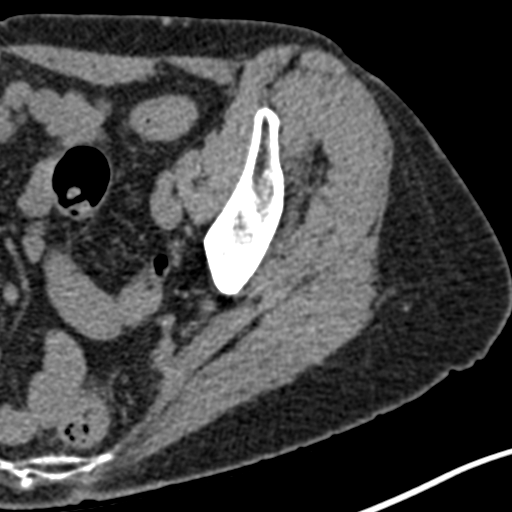
[im 86/99  soft-tissue]
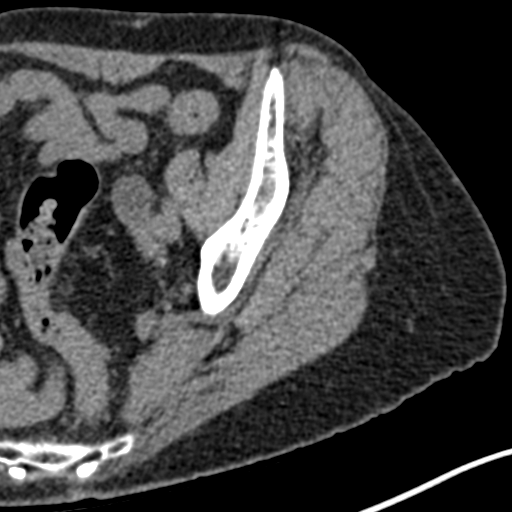
[im 86/99  lung]
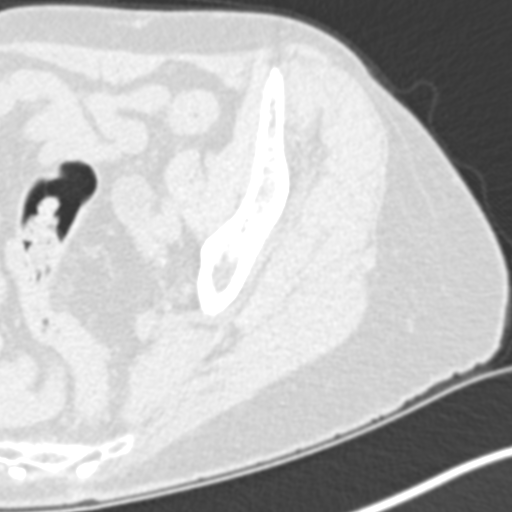
[im 89/99  lung]
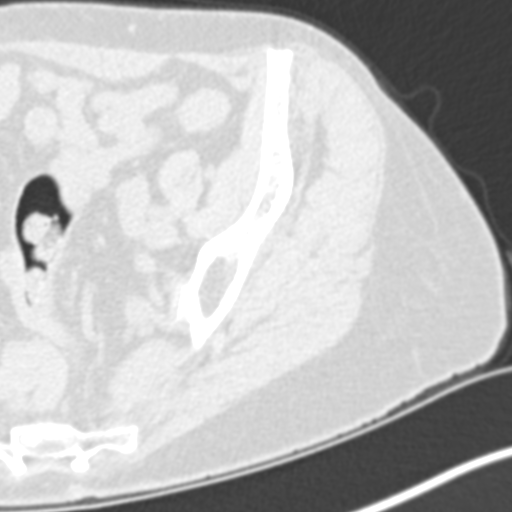
[im 92/99  soft-tissue]
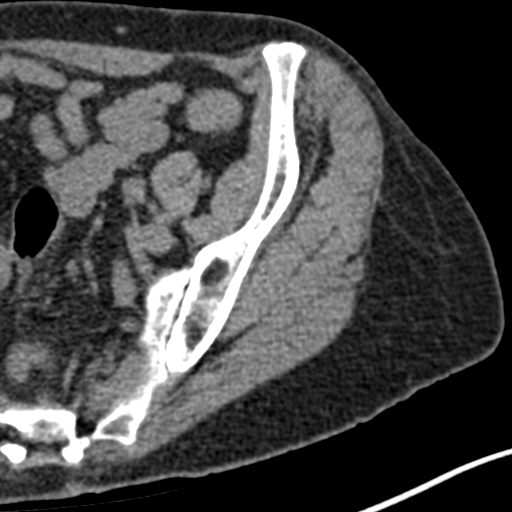
[im 92/99  lung]
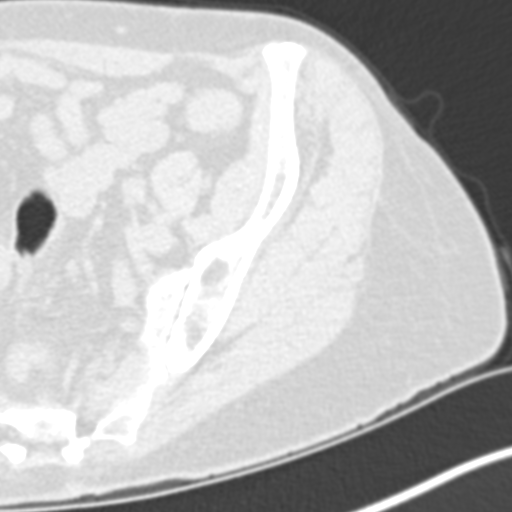
[im 95/99  lung]
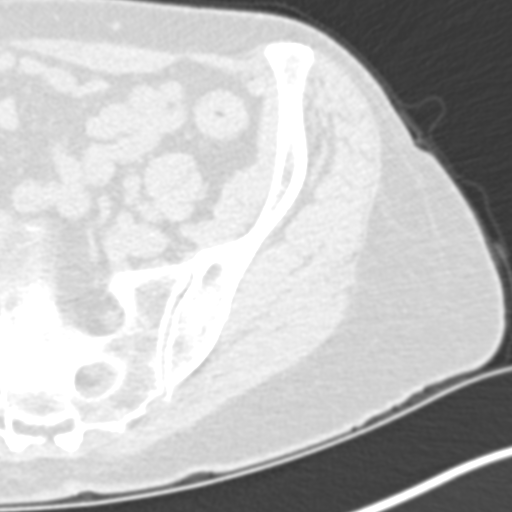

[15 of 32 positions shown; findings below may reference images not displayed]

FINDINGS: The 3 cannulated screws are not changed in position. Because of the
subcapital nature of the fracture and the length of the threaded
portion of the screws, the threaded part of the screws is likely in
the vicinity of the original fracture plane, rather than a lagging
the fracture plane. That said, there seems to of been healing at the
fracture site, with the original fracture still visible as cortical
discontinuity along the margins, but with seemingly bony bridging,
and without findings of nonunion. No penetration or capture of the
femoral head cortical margin by the screws.

I do not see an acetabular fracture or other regional fracture in
the visualized part of the left hemipelvis. No significant regional
fluid collection is apparent. No sciatic notch impingement. No
compelling findings of a significant hip joint effusion or regional
bursitis.
IMPRESSION: 1. I do not see a specific CT abnormality to correlate with the
patient's continued left hip pain. Although the margins of the
subcapital hip fracture continue to demonstrate irregular spurring,
I do not observe visible fracture plane traversing the femoral neck
and the appearance would typically be considered compatible with
healing fracture. Certainly there is no sclerosis or nonunion
visible traversing the femoral neck. The cannulated screws extend
into the femoral head and most likely the threaded portions of the
screws are crossing the original fracture plane rather than lagging
the original fracture plane due to the length of the threaded
portions and the subcapital nature of the fracture.

## 2018-03-29 ENCOUNTER — Emergency Department
Admission: EM | Admit: 2018-03-29 | Discharge: 2018-03-29 | Disposition: A | Discharge status: Home / Self Care | Payer: Medicare Other | Attending: Emergency Medicine | Admitting: Emergency Medicine

## 2018-03-29 ENCOUNTER — Other Ambulatory Visit: Payer: Self-pay

## 2018-03-29 DIAGNOSIS — Z5321 Procedure and treatment not carried out due to patient leaving prior to being seen by health care provider: Secondary | ICD-10-CM | POA: Diagnosis not present

## 2018-03-29 DIAGNOSIS — R51 Headache: Secondary | ICD-10-CM | POA: Insufficient documentation

## 2018-03-29 NOTE — ED Triage Notes (Signed)
Pt arrived to ed via POV. Pt states she has had migraine x 13 days. Pt Hx migraine. Pt states "this one is different, I have been having memory problems, confusion, dizziness, weakness, falling down, nausea vomiting. Pt takes Topamax regularly for migraines but has had no relief. Pt A&O x4 during triage. No acute distress noted
